# Patient Record
Sex: Female | Born: 1950 | Race: Asian | Hispanic: No | Marital: Married | Smoking: Never smoker
Health system: Southern US, Community
[De-identification: ages and names within clinical notes are randomized; demographics above are authoritative.]

## PROBLEM LIST (undated history)

## (undated) DIAGNOSIS — M8580 Other specified disorders of bone density and structure, unspecified site: Secondary | ICD-10-CM

## (undated) DIAGNOSIS — K219 Gastro-esophageal reflux disease without esophagitis: Secondary | ICD-10-CM

## (undated) DIAGNOSIS — H269 Unspecified cataract: Secondary | ICD-10-CM

## (undated) DIAGNOSIS — E785 Hyperlipidemia, unspecified: Secondary | ICD-10-CM

## (undated) DIAGNOSIS — R7611 Nonspecific reaction to tuberculin skin test without active tuberculosis: Secondary | ICD-10-CM

## (undated) DIAGNOSIS — M1990 Unspecified osteoarthritis, unspecified site: Secondary | ICD-10-CM

## (undated) HISTORY — DX: Unspecified cataract: H26.9

## (undated) HISTORY — DX: Nonspecific reaction to tuberculin skin test without active tuberculosis: R76.11

## (undated) HISTORY — DX: Other specified disorders of bone density and structure, unspecified site: M85.80

## (undated) HISTORY — DX: Hyperlipidemia, unspecified: E78.5

## (undated) HISTORY — PX: BREAST BIOPSY: SHX20

## (undated) HISTORY — DX: Gastro-esophageal reflux disease without esophagitis: K21.9

## (undated) HISTORY — DX: Unspecified osteoarthritis, unspecified site: M19.90

---

## 1994-03-31 HISTORY — PX: BREAST LUMPECTOMY: SHX2

## 1994-03-31 HISTORY — PX: BREAST EXCISIONAL BIOPSY: SUR124

## 1998-01-03 ENCOUNTER — Encounter: Payer: Self-pay | Admitting: Gynecology

## 1998-01-03 ENCOUNTER — Ambulatory Visit (HOSPITAL_COMMUNITY): Admission: RE | Admit: 1998-01-03 | Discharge: 1998-01-03 | Payer: Self-pay | Admitting: Gynecology

## 1998-09-05 ENCOUNTER — Encounter: Payer: Self-pay | Admitting: Internal Medicine

## 1998-09-05 ENCOUNTER — Ambulatory Visit (HOSPITAL_COMMUNITY): Admission: RE | Admit: 1998-09-05 | Discharge: 1998-09-05 | Payer: Self-pay | Admitting: Internal Medicine

## 1998-10-04 ENCOUNTER — Encounter: Payer: Self-pay | Admitting: Emergency Medicine

## 1998-10-04 ENCOUNTER — Emergency Department (HOSPITAL_COMMUNITY): Admission: EM | Admit: 1998-10-04 | Discharge: 1998-10-04 | Payer: Self-pay | Admitting: Emergency Medicine

## 1999-05-01 ENCOUNTER — Other Ambulatory Visit: Admission: RE | Admit: 1999-05-01 | Discharge: 1999-05-01 | Payer: Self-pay | Admitting: Gynecology

## 2000-06-12 ENCOUNTER — Encounter: Payer: Self-pay | Admitting: Gynecology

## 2000-06-12 ENCOUNTER — Ambulatory Visit (HOSPITAL_COMMUNITY): Admission: RE | Admit: 2000-06-12 | Discharge: 2000-06-12 | Payer: Self-pay | Admitting: Gynecology

## 2000-07-16 ENCOUNTER — Other Ambulatory Visit: Admission: RE | Admit: 2000-07-16 | Discharge: 2000-07-16 | Payer: Self-pay | Admitting: Gynecology

## 2001-08-16 ENCOUNTER — Encounter: Payer: Self-pay | Admitting: Internal Medicine

## 2001-08-16 ENCOUNTER — Ambulatory Visit (HOSPITAL_COMMUNITY): Admission: RE | Admit: 2001-08-16 | Discharge: 2001-08-16 | Payer: Self-pay | Admitting: Internal Medicine

## 2001-08-16 ENCOUNTER — Encounter: Payer: Self-pay | Admitting: Gynecology

## 2001-08-16 ENCOUNTER — Ambulatory Visit (HOSPITAL_COMMUNITY): Admission: RE | Admit: 2001-08-16 | Discharge: 2001-08-16 | Payer: Self-pay | Admitting: Gynecology

## 2001-09-01 ENCOUNTER — Other Ambulatory Visit: Admission: RE | Admit: 2001-09-01 | Discharge: 2001-09-01 | Payer: Self-pay | Admitting: Gynecology

## 2002-03-31 HISTORY — PX: WRIST FRACTURE SURGERY: SHX121

## 2002-04-13 ENCOUNTER — Encounter (INDEPENDENT_AMBULATORY_CARE_PROVIDER_SITE_OTHER): Payer: Self-pay | Admitting: Specialist

## 2002-04-13 ENCOUNTER — Ambulatory Visit (HOSPITAL_COMMUNITY): Admission: RE | Admit: 2002-04-13 | Discharge: 2002-04-13 | Payer: Self-pay | Admitting: Gastroenterology

## 2002-10-05 ENCOUNTER — Encounter: Payer: Self-pay | Admitting: Gynecology

## 2002-10-05 ENCOUNTER — Ambulatory Visit (HOSPITAL_COMMUNITY): Admission: RE | Admit: 2002-10-05 | Discharge: 2002-10-05 | Payer: Self-pay | Admitting: Gynecology

## 2002-10-17 ENCOUNTER — Other Ambulatory Visit: Admission: RE | Admit: 2002-10-17 | Discharge: 2002-10-17 | Payer: Self-pay | Admitting: Gynecology

## 2002-12-18 ENCOUNTER — Encounter: Payer: Self-pay | Admitting: Emergency Medicine

## 2002-12-18 ENCOUNTER — Emergency Department (HOSPITAL_COMMUNITY): Admission: EM | Admit: 2002-12-18 | Discharge: 2002-12-18 | Payer: Self-pay | Admitting: Emergency Medicine

## 2004-01-11 ENCOUNTER — Ambulatory Visit (HOSPITAL_COMMUNITY): Admission: RE | Admit: 2004-01-11 | Discharge: 2004-01-11 | Payer: Self-pay | Admitting: Gynecology

## 2004-02-08 ENCOUNTER — Other Ambulatory Visit: Admission: RE | Admit: 2004-02-08 | Discharge: 2004-02-08 | Payer: Self-pay | Admitting: Gynecology

## 2005-04-08 ENCOUNTER — Other Ambulatory Visit: Admission: RE | Admit: 2005-04-08 | Discharge: 2005-04-08 | Payer: Self-pay | Admitting: Gynecology

## 2005-06-27 ENCOUNTER — Ambulatory Visit (HOSPITAL_COMMUNITY): Admission: RE | Admit: 2005-06-27 | Discharge: 2005-06-27 | Payer: Self-pay | Admitting: Gynecology

## 2006-05-18 ENCOUNTER — Other Ambulatory Visit: Admission: RE | Admit: 2006-05-18 | Discharge: 2006-05-18 | Payer: Self-pay | Admitting: Gynecology

## 2006-07-13 ENCOUNTER — Ambulatory Visit (HOSPITAL_COMMUNITY): Admission: RE | Admit: 2006-07-13 | Discharge: 2006-07-13 | Payer: Self-pay | Admitting: Gastroenterology

## 2006-09-21 ENCOUNTER — Ambulatory Visit (HOSPITAL_COMMUNITY): Admission: RE | Admit: 2006-09-21 | Discharge: 2006-09-21 | Payer: Self-pay | Admitting: Gynecology

## 2007-02-03 ENCOUNTER — Encounter: Admission: RE | Admit: 2007-02-03 | Discharge: 2007-02-03 | Payer: Self-pay | Admitting: Internal Medicine

## 2007-07-15 ENCOUNTER — Other Ambulatory Visit: Admission: RE | Admit: 2007-07-15 | Discharge: 2007-07-15 | Payer: Self-pay | Admitting: Gynecology

## 2007-09-22 ENCOUNTER — Ambulatory Visit (HOSPITAL_COMMUNITY): Admission: RE | Admit: 2007-09-22 | Discharge: 2007-09-22 | Payer: Self-pay | Admitting: Internal Medicine

## 2008-11-16 ENCOUNTER — Ambulatory Visit (HOSPITAL_COMMUNITY): Admission: RE | Admit: 2008-11-16 | Discharge: 2008-11-16 | Payer: Self-pay | Admitting: Gynecology

## 2008-12-06 ENCOUNTER — Ambulatory Visit (HOSPITAL_COMMUNITY): Admission: RE | Admit: 2008-12-06 | Discharge: 2008-12-06 | Payer: Self-pay | Admitting: Gynecology

## 2009-11-20 ENCOUNTER — Ambulatory Visit (HOSPITAL_COMMUNITY): Admission: RE | Admit: 2009-11-20 | Discharge: 2009-11-20 | Payer: Self-pay | Admitting: Gynecology

## 2010-03-31 HISTORY — PX: SHOULDER ARTHROSCOPY: SHX128

## 2010-04-12 ENCOUNTER — Ambulatory Visit
Admission: RE | Admit: 2010-04-12 | Discharge: 2010-04-13 | Payer: Self-pay | Source: Home / Self Care | Attending: Orthopedic Surgery | Admitting: Orthopedic Surgery

## 2010-04-15 LAB — POCT HEMOGLOBIN-HEMACUE: Hemoglobin: 13.5 g/dL (ref 12.0–15.0)

## 2010-08-16 NOTE — Op Note (Signed)
   NAME:  Claire [name], Claire [name]                        ACCOUNT NO.:  [id number]   MEDICAL RECORD NO.:  [id number]                   PATIENT TYPE:  AMB   LOCATION:  ENDO                                 FACILITY:  [facility]   PHYSICIAN:  Bernette Redbird, M.D.                DATE OF BIRTH:  06-18-1950   DATE OF PROCEDURE:  04/13/2002  DATE OF DISCHARGE:                                 OPERATIVE REPORT   PROCEDURE PERFORMED:  Upper endoscopy.   ENDOSCOPIST:  Florencia Reasons, M.D.   INDICATIONS FOR PROCEDURE:  The patient is a 60 year old female with reflux  symptoms.   FINDINGS:  Normal exam.   DESCRIPTION OF PROCEDURE:  The nature, purpose and risks of the procedure  were familiar to the patient, who provided written consent.  Sedation was  fentanyl  25 mcg and Versed 2 mg IV without significant arrhythmias or  desaturation (she had a baseline bradycardia around 50).  The Olympus video  endoscope was passed under direct vision.  The vocal cords were not well  seen because the esophagus was very easily entered but no gross laryngeal  abnormalities were identified.   The esophagus was endoscopically normal.  There was no evidence of free  reflux, esophageal spasm, reflux esophagitis, Barrett's esophagus, varices,  infection, neoplasia or any ring, stricture or hiatal hernia.   Careful inspection of the Z-line showed slight irregularity and even a few  tiny islands of columnar appearing mucosa just above the Z-line but I did  not see any classic tongues of Barrett's mucosa and no biopsies were  obtained.   The stomach contained no significant residual and had normal mucosa  including a retroflex view of the cardia which showed no hiatal hernia.  No  gastritis, erosions, ulcers, polyps or masses were seen and the pylorus,  duodenal bulb and second duodenum looked normal.   The scope was then removed from the patient.  No biopsies were obtained.  The patient tolerated the procedure well and  there were no apparent  complications.   IMPRESSION:  Normal endoscopy.    PLAN:  The patient may continue to use p.r.n. proton pump inhibitor therapy  and I see no clear indication for endoscopic follow-up for this patient.                                                 Bernette Redbird, M.D.    RB/MEDQ  D:  04/13/2002  T:  04/13/2002  Job:  [id number]   cc:   Lucky Cowboy, M.D.  [address]  [place], [place] [address]  Fax: [phone]

## 2010-08-16 NOTE — Op Note (Signed)
NAME:  Claire [name], Claire [name]                        ACCOUNT NO.:  [id number]   MEDICAL RECORD NO.:  [id number]                   PATIENT TYPE:  AMB   LOCATION:  ENDO                                 FACILITY:  [facility]   PHYSICIAN:  Bernette Redbird, M.D.                DATE OF BIRTH:  [birth year]   DATE OF PROCEDURE:  04/13/2002  DATE OF DISCHARGE:                                 OPERATIVE REPORT   PROCEDURE PERFORMED:  Colonoscopy.   ENDOSCOPIST:  Florencia Reasons, M.D.   INDICATIONS FOR PROCEDURE:  The patient is a 60 year old female with a  family history of colon cancer in a grandparent at an advanced age, who also  has a previous history of possible collagenous colitis based on random  biopsies when she was bothered by diarrhea some years ago.  At this time she  has only occasional postprandial urgency with defecation and diarrhea.   FINDINGS:  Normal exam to the cecum.   DESCRIPTION OF PROCEDURE:  The nature, purpose and risks of the procedure  were familiar to the patient, who provided written consent.  Sedation for  this procedure and the upper endoscopy was fentanyl 50 mcg and Versed 3 mg  IV without clinical instability during the course of the procedure.   The Olympus adjustable tension pediatric video colonoscope was advanced  around the colon, turning the patient into the supine position and using  some external abdominal compression to control looping.  The cecum was  identified by visualization of the appendiceal orifice.  Pullback was then  performed.   There was a small amount of retained vegetable debris which could not be  suctioned up and which puddled here and there in the colon obscuring small  areas of the colonic mucosa but it is not felt that any large or significant  lesions would have been missed.  The prep otherwise was excellent.   This was a normal examination.  No polyps, cancer, masses, colitis, vascular  ectasia or diverticulosis were noted.   Retroflexion in the rectum as well as  reinspection of the rectosigmoid was otherwise unremarkable as well.  In the  cecum there was some questionable mottled erythema but I don't think this  was clinically significant.  There was no evidence of any erosive changes,  exudate, etc.   Random mucosal biopsies were obtained along the length of the colon to look  for any evidence of ongoing collagenous colitis during pullback of the  scope.  The patient tolerated the procedure well and there were no apparent  complications.   IMPRESSION:  Normal colonoscopy.   PLAN:  Await pathology and random mucosal biopsies, although, even if they  show some collagenous colitis, since the patient has minimal symptoms at  this time, I don't feel that therapy would be imperative.   The patient tolerated the procedure well and there were no apparent  complications.  IMPRESSION:  1.  2.   PLAN:                                                 Bernette Redbird, M.D.    RB/MEDQ  D:  04/13/2002  T:  04/13/2002  Job:  [id number]   cc:   Lucky Cowboy, M.D.  [address]  [place], [place] [address]  Fax: [phone]

## 2011-01-09 ENCOUNTER — Other Ambulatory Visit (HOSPITAL_COMMUNITY): Payer: Self-pay | Admitting: Gynecology

## 2011-01-09 DIAGNOSIS — Z1231 Encounter for screening mammogram for malignant neoplasm of breast: Secondary | ICD-10-CM

## 2011-01-22 ENCOUNTER — Ambulatory Visit (HOSPITAL_COMMUNITY)
Admission: RE | Admit: 2011-01-22 | Discharge: 2011-01-22 | Disposition: A | Discharge status: Home / Self Care | Payer: BC Managed Care – PPO | Source: Ambulatory Visit | Attending: Gynecology | Admitting: Gynecology

## 2011-01-22 DIAGNOSIS — Z1231 Encounter for screening mammogram for malignant neoplasm of breast: Secondary | ICD-10-CM | POA: Insufficient documentation

## 2011-10-28 ENCOUNTER — Ambulatory Visit: Payer: BC Managed Care – PPO | Attending: Physical Medicine and Rehabilitation | Admitting: Rehabilitation

## 2011-10-28 DIAGNOSIS — M545 Low back pain, unspecified: Secondary | ICD-10-CM | POA: Insufficient documentation

## 2011-10-28 DIAGNOSIS — IMO0001 Reserved for inherently not codable concepts without codable children: Secondary | ICD-10-CM | POA: Insufficient documentation

## 2011-10-28 DIAGNOSIS — R293 Abnormal posture: Secondary | ICD-10-CM | POA: Insufficient documentation

## 2011-10-30 ENCOUNTER — Ambulatory Visit: Payer: BC Managed Care – PPO | Attending: Physical Medicine and Rehabilitation | Admitting: Rehabilitation

## 2011-10-30 DIAGNOSIS — R293 Abnormal posture: Secondary | ICD-10-CM | POA: Insufficient documentation

## 2011-10-30 DIAGNOSIS — M545 Low back pain, unspecified: Secondary | ICD-10-CM | POA: Insufficient documentation

## 2011-10-30 DIAGNOSIS — IMO0001 Reserved for inherently not codable concepts without codable children: Secondary | ICD-10-CM | POA: Insufficient documentation

## 2011-11-04 ENCOUNTER — Ambulatory Visit: Payer: BC Managed Care – PPO | Admitting: Rehabilitation

## 2011-11-11 ENCOUNTER — Ambulatory Visit: Payer: BC Managed Care – PPO | Admitting: Rehabilitation

## 2011-11-13 ENCOUNTER — Ambulatory Visit: Payer: BC Managed Care – PPO | Admitting: Rehabilitation

## 2011-11-18 ENCOUNTER — Ambulatory Visit: Payer: BC Managed Care – PPO | Admitting: Rehabilitation

## 2011-11-20 ENCOUNTER — Ambulatory Visit: Payer: BC Managed Care – PPO | Admitting: Rehabilitation

## 2011-11-26 ENCOUNTER — Other Ambulatory Visit (HOSPITAL_COMMUNITY): Payer: Self-pay | Admitting: Orthopedic Surgery

## 2011-11-26 DIAGNOSIS — M79672 Pain in left foot: Secondary | ICD-10-CM

## 2011-11-26 DIAGNOSIS — M84375A Stress fracture, left foot, initial encounter for fracture: Secondary | ICD-10-CM

## 2011-12-05 ENCOUNTER — Encounter (HOSPITAL_COMMUNITY): Payer: BC Managed Care – PPO

## 2011-12-05 ENCOUNTER — Other Ambulatory Visit (HOSPITAL_COMMUNITY): Payer: BC Managed Care – PPO

## 2011-12-09 ENCOUNTER — Encounter: Payer: BC Managed Care – PPO | Admitting: Rehabilitation

## 2011-12-10 ENCOUNTER — Encounter (HOSPITAL_COMMUNITY)
Admission: RE | Admit: 2011-12-10 | Discharge: 2011-12-10 | Disposition: A | Discharge status: Home / Self Care | Payer: BC Managed Care – PPO | Source: Ambulatory Visit | Attending: Orthopedic Surgery | Admitting: Orthopedic Surgery

## 2011-12-10 DIAGNOSIS — M79609 Pain in unspecified limb: Secondary | ICD-10-CM | POA: Insufficient documentation

## 2011-12-10 DIAGNOSIS — M79672 Pain in left foot: Secondary | ICD-10-CM

## 2011-12-10 DIAGNOSIS — M84375A Stress fracture, left foot, initial encounter for fracture: Secondary | ICD-10-CM

## 2011-12-10 MED ORDER — TECHNETIUM TC 99M MEDRONATE IV KIT
25.0000 | PACK | Freq: Once | INTRAVENOUS | Status: AC | PRN
  Administered 2011-12-10: 25 via INTRAVENOUS

## 2012-10-26 ENCOUNTER — Other Ambulatory Visit: Payer: Self-pay | Admitting: Gastroenterology

## 2013-11-29 DIAGNOSIS — H02839 Dermatochalasis of unspecified eye, unspecified eyelid: Secondary | ICD-10-CM | POA: Diagnosis not present

## 2013-11-29 DIAGNOSIS — H18419 Arcus senilis, unspecified eye: Secondary | ICD-10-CM | POA: Diagnosis not present

## 2013-11-29 DIAGNOSIS — H2510 Age-related nuclear cataract, unspecified eye: Secondary | ICD-10-CM | POA: Diagnosis not present

## 2013-11-29 DIAGNOSIS — H04129 Dry eye syndrome of unspecified lacrimal gland: Secondary | ICD-10-CM | POA: Diagnosis not present

## 2013-12-01 DIAGNOSIS — L908 Other atrophic disorders of skin: Secondary | ICD-10-CM | POA: Diagnosis not present

## 2013-12-01 DIAGNOSIS — D1801 Hemangioma of skin and subcutaneous tissue: Secondary | ICD-10-CM | POA: Diagnosis not present

## 2013-12-01 DIAGNOSIS — M7130 Other bursal cyst, unspecified site: Secondary | ICD-10-CM | POA: Diagnosis not present

## 2013-12-01 DIAGNOSIS — D235 Other benign neoplasm of skin of trunk: Secondary | ICD-10-CM | POA: Diagnosis not present

## 2013-12-01 DIAGNOSIS — L821 Other seborrheic keratosis: Secondary | ICD-10-CM | POA: Diagnosis not present

## 2013-12-01 DIAGNOSIS — L918 Other hypertrophic disorders of the skin: Secondary | ICD-10-CM | POA: Diagnosis not present

## 2013-12-01 DIAGNOSIS — L819 Disorder of pigmentation, unspecified: Secondary | ICD-10-CM | POA: Diagnosis not present

## 2013-12-01 DIAGNOSIS — D239 Other benign neoplasm of skin, unspecified: Secondary | ICD-10-CM | POA: Diagnosis not present

## 2013-12-15 DIAGNOSIS — M7150 Other bursitis, not elsewhere classified, unspecified site: Secondary | ICD-10-CM | POA: Diagnosis not present

## 2013-12-15 DIAGNOSIS — M79609 Pain in unspecified limb: Secondary | ICD-10-CM | POA: Diagnosis not present

## 2013-12-15 DIAGNOSIS — M25579 Pain in unspecified ankle and joints of unspecified foot: Secondary | ICD-10-CM | POA: Diagnosis not present

## 2013-12-15 DIAGNOSIS — M2010 Hallux valgus (acquired), unspecified foot: Secondary | ICD-10-CM | POA: Diagnosis not present

## 2013-12-21 DIAGNOSIS — Z23 Encounter for immunization: Secondary | ICD-10-CM | POA: Diagnosis not present

## 2014-01-03 DIAGNOSIS — M47817 Spondylosis without myelopathy or radiculopathy, lumbosacral region: Secondary | ICD-10-CM | POA: Diagnosis not present

## 2014-01-03 DIAGNOSIS — M545 Low back pain: Secondary | ICD-10-CM | POA: Diagnosis not present

## 2014-02-28 DIAGNOSIS — E785 Hyperlipidemia, unspecified: Secondary | ICD-10-CM | POA: Diagnosis not present

## 2014-05-24 DIAGNOSIS — M19041 Primary osteoarthritis, right hand: Secondary | ICD-10-CM | POA: Diagnosis not present

## 2014-05-24 DIAGNOSIS — M19011 Primary osteoarthritis, right shoulder: Secondary | ICD-10-CM | POA: Diagnosis not present

## 2014-08-02 ENCOUNTER — Ambulatory Visit (INDEPENDENT_AMBULATORY_CARE_PROVIDER_SITE_OTHER): Payer: Medicare Other | Admitting: Family Medicine

## 2014-08-02 ENCOUNTER — Encounter: Payer: Self-pay | Admitting: Family Medicine

## 2014-08-02 VITALS — BP 120/78 | HR 65 | Temp 98.3°F | Ht 61.42 in | Wt 131.0 lb

## 2014-08-02 DIAGNOSIS — R7611 Nonspecific reaction to tuberculin skin test without active tuberculosis: Secondary | ICD-10-CM

## 2014-08-02 DIAGNOSIS — K219 Gastro-esophageal reflux disease without esophagitis: Secondary | ICD-10-CM

## 2014-08-02 DIAGNOSIS — M19042 Primary osteoarthritis, left hand: Secondary | ICD-10-CM

## 2014-08-02 DIAGNOSIS — T50A95A Adverse effect of other bacterial vaccines, initial encounter: Secondary | ICD-10-CM

## 2014-08-02 DIAGNOSIS — E785 Hyperlipidemia, unspecified: Secondary | ICD-10-CM | POA: Insufficient documentation

## 2014-08-02 DIAGNOSIS — M8580 Other specified disorders of bone density and structure, unspecified site: Secondary | ICD-10-CM | POA: Insufficient documentation

## 2014-08-02 DIAGNOSIS — L659 Nonscarring hair loss, unspecified: Secondary | ICD-10-CM

## 2014-08-02 DIAGNOSIS — M19041 Primary osteoarthritis, right hand: Secondary | ICD-10-CM | POA: Insufficient documentation

## 2014-08-02 MED ORDER — PANTOPRAZOLE SODIUM 40 MG PO TBEC: 40.0000 mg | DELAYED_RELEASE_TABLET | Freq: Every day | ORAL | Status: DC

## 2014-08-02 MED ORDER — ATOVAQUONE-PROGUANIL HCL 250-100 MG PO TABS: ORAL_TABLET | ORAL | Status: DC

## 2014-08-02 MED ORDER — CIPROFLOXACIN HCL 500 MG PO TABS: 500.0000 mg | ORAL_TABLET | Freq: Two times a day (BID) | ORAL | Status: DC

## 2014-08-02 NOTE — Progress Notes (Signed)
Subjective:    Patient ID: Claire [name], female    DOB: 26-Apr-1950, 64 y.o.   MRN: [id number]  HPI Patient seen to establish care. 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 She has previously been seen by internal medicine physician in [place]. Surgical history reviewed. She's had previous fracture from fall including right wrist. She takes Fosamax for osteopenia as well as calcium and vitamin D.  Requesting prescription for pantoprazole which she has taken for reflux with good success the past. No dysphagia  She takes Lipitor for hyperlipidemia. No history of CAD or peripheral vascular disease. Generally gets complete physical yearly  Recently noted some very localized hair loss right parietal area area approximately one half centimeter. No generalized alopecia.  Trip this summer to the [place]  Needs malaria prevention.  Hep A has already been given and tetanus up to date.  Family history and social history reviewed:  Past Medical History  Diagnosis Date  . Arthritis   . GERD (gastroesophageal reflux disease)   . Positive TB test   . Osteopenia    Past Surgical History  Procedure Laterality Date  . Cesarean section  1982  . Breast lumpectomy Left 1996    benign  . Wrist fracture surgery Right 2004    Right  . Shoulder arthroscopy Left 2012    left    reports that she has never smoked. She has never used smokeless tobacco. She reports that she does not drink alcohol. Her drug history is not on file. family history includes Hyperlipidemia in her father. Allergies  Allergen Reactions  . Penicillins       Review of Systems  Constitutional: Negative for fatigue and unexpected weight change.  Eyes: Negative for visual disturbance.  Respiratory: Negative for cough, chest tightness, shortness of breath and wheezing.    Cardiovascular: Negative for chest pain, palpitations and leg swelling.  Musculoskeletal: Positive for arthralgias.  Neurological: Negative for dizziness, seizures, syncope, weakness, light-headedness and headaches.       Objective:   Physical Exam  Constitutional: She appears well-developed and well-nourished.  HENT:  Right Ear: External ear normal.  Left Ear: External ear normal.  Mouth/Throat: Oropharynx is clear and moist.  Neck: Neck supple. No thyromegaly present.  Cardiovascular: Normal rate and regular rhythm.   Pulmonary/Chest: Effort normal and breath sounds normal. No respiratory distress. She has no wheezes. She has no rales.  Musculoskeletal: She exhibits no edema.  She has some hypertrophic changes of both hands consistent with osteoarthritis  Lymphadenopathy:    She has no cervical adenopathy.  Skin:  1 cm circumferential area of hair loss right parietal scalp.  Non-scaly.           Assessment & Plan:  #1 hyperlipidemia. Patient on Lipitor. Will check lipids at follow-up for physical #2 GERD. Prescription for pantoprazole 40 mg once daily. #3 osteoarthritis involving hands. Minimal pain. Minimize non-steroidals, if possible #4 osteopenia. Discuss setting up DEXA scan at physical. 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 Upcoming travel to [place] Her tetanus and hepatitis A are up-to-date. Cipro 500 mg 1 twice a day for 3 days as needed for traveler's diarrhea. Malarone 250 mg 1 daily starting 1-2 days prior to travel, during travel, and for one week after return. #7  Very  localized hair loss.  ?Alopecia areata.  Consider derm referral if area enlarging, otherwise will keep an eye on this.

## 2014-09-19 ENCOUNTER — Ambulatory Visit: Payer: BC Managed Care – PPO | Admitting: Family Medicine

## 2014-09-29 LAB — HM MAMMOGRAPHY: HM Mammogram: NORMAL (ref 0–4)

## 2014-09-29 LAB — HM PAP SMEAR: HM PAP: NORMAL

## 2014-10-03 ENCOUNTER — Ambulatory Visit (INDEPENDENT_AMBULATORY_CARE_PROVIDER_SITE_OTHER): Payer: Medicare Other | Admitting: Family Medicine

## 2014-10-03 ENCOUNTER — Encounter: Payer: Self-pay | Admitting: Family Medicine

## 2014-10-03 VITALS — BP 120/70 | HR 58 | Temp 97.7°F | Ht 61.42 in | Wt 133.0 lb

## 2014-10-03 DIAGNOSIS — E785 Hyperlipidemia, unspecified: Secondary | ICD-10-CM | POA: Diagnosis not present

## 2014-10-03 DIAGNOSIS — Z23 Encounter for immunization: Secondary | ICD-10-CM | POA: Diagnosis not present

## 2014-10-03 DIAGNOSIS — M8580 Other specified disorders of bone density and structure, unspecified site: Secondary | ICD-10-CM | POA: Diagnosis not present

## 2014-10-03 DIAGNOSIS — Z862 Personal history of diseases of the blood and blood-forming organs and certain disorders involving the immune mechanism: Secondary | ICD-10-CM | POA: Diagnosis not present

## 2014-10-03 DIAGNOSIS — L659 Nonscarring hair loss, unspecified: Secondary | ICD-10-CM | POA: Diagnosis not present

## 2014-10-03 DIAGNOSIS — Z0000 Encounter for general adult medical examination without abnormal findings: Secondary | ICD-10-CM | POA: Diagnosis not present

## 2014-10-03 DIAGNOSIS — K219 Gastro-esophageal reflux disease without esophagitis: Secondary | ICD-10-CM

## 2014-10-03 LAB — BASIC METABOLIC PANEL
BUN: 15 mg/dL (ref 6–23)
CHLORIDE: 105 meq/L (ref 96–112)
CO2: 28 meq/L (ref 19–32)
CREATININE: 0.79 mg/dL (ref 0.40–1.20)
Calcium: 9.7 mg/dL (ref 8.4–10.5)
GFR: 77.44 mL/min (ref >60.00)
Glucose, Bld: 101 mg/dL — ABNORMAL HIGH (ref 70–99)
Potassium: 3.8 mEq/L (ref 3.5–5.1)
SODIUM: 141 meq/L (ref 135–145)

## 2014-10-03 LAB — CBC WITH DIFFERENTIAL/PLATELET
BASOS PCT: 0.4 % (ref 0.0–3.0)
Basophils Absolute: 0 10*3/uL (ref 0.0–0.1)
EOS PCT: 3.5 % (ref 0.0–5.0)
Eosinophils Absolute: 0.1 10*3/uL (ref 0.0–0.7)
HCT: 40.1 % (ref 36.0–46.0)
HEMOGLOBIN: 13.4 g/dL (ref 12.0–15.0)
LYMPHS PCT: 42.8 % (ref 12.0–46.0)
Lymphs Abs: 1.8 10*3/uL (ref 0.7–4.0)
MCHC: 33.4 g/dL (ref 30.0–36.0)
MCV: 89.8 fl (ref 78.0–100.0)
MONOS PCT: 6.9 % (ref 3.0–12.0)
Monocytes Absolute: 0.3 10*3/uL (ref 0.1–1.0)
NEUTROS ABS: 1.9 10*3/uL (ref 1.4–7.7)
Neutrophils Relative %: 46.4 % (ref 43.0–77.0)
Platelets: 235 10*3/uL (ref 150.0–400.0)
RBC: 4.47 Mil/uL (ref 3.87–5.11)
RDW: 13.6 % (ref 11.5–15.5)
WBC: 4.1 10*3/uL (ref 4.0–10.5)

## 2014-10-03 LAB — LIPID PANEL
CHOL/HDL RATIO: 3
CHOLESTEROL: 204 mg/dL — AB (ref 0–200)
HDL: 61.4 mg/dL (ref >39.00)
LDL CALC: 114 mg/dL — AB (ref 0–99)
NonHDL: 142.6
TRIGLYCERIDES: 141 mg/dL (ref 0.0–149.0)
VLDL: 28.2 mg/dL (ref 0.0–40.0)

## 2014-10-03 LAB — HEPATIC FUNCTION PANEL
ALT: 33 U/L (ref 0–35)
AST: 28 U/L (ref 0–37)
Albumin: 4.2 g/dL (ref 3.5–5.2)
Alkaline Phosphatase: 44 U/L (ref 39–117)
Bilirubin, Direct: 0.1 mg/dL (ref 0.0–0.3)
TOTAL PROTEIN: 7.4 g/dL (ref 6.0–8.3)
Total Bilirubin: 0.7 mg/dL (ref 0.2–1.2)

## 2014-10-03 LAB — TSH: TSH: 4.66 u[IU]/mL — ABNORMAL HIGH (ref 0.35–4.50)

## 2014-10-03 NOTE — Progress Notes (Signed)
Pre visit review using our clinic review tool, if applicable. No additional management support is needed unless otherwise documented below in the visit note. 

## 2014-10-03 NOTE — Patient Instructions (Signed)
Continue yearly flu vaccine. Continue regular use of calcium and vitamin D and weightbearing exercise Would consider two-year interval off Fosamax if you've been on this continuously for more than 5 years Continue with yearly mammograms

## 2014-10-03 NOTE — Progress Notes (Signed)
Subjective:    Patient ID: Claire [name], female    DOB: 24-Mar-1951, 64 y.o.   MRN: [id number]  HPI Patient is here for Medicare wellness exam and medical follow-up. Her chronic problems include history of GERD, osteopenia, osteoarthritis, hyperlipidemia. She has history of positive PPD related to BCG vaccination in childhood. She's had her last tetanus less than 10 years ago. We do not have exact date. Last colonoscopy about 5 years ago. She gets mammograms every year and this is up-to-date. She has history of reported osteopenia. Last DEXA scan over 2 years ago. On regular calcium and vitamin D.  Recent mild alopecia right parietal area. No fatigue issues. No recent falls. No depression.  Hyperlipidemia treated with atorvastatin. No myalgias. She has GERD which has been controlled with pantoprazole. Occasional breakthrough symptoms. No dysphagia. No appetite or weight changes.  1.  Risk factors based on Past Medical , Social, and Family history reviewed and as indicated above with no changes 2.  Limitations in physical activities None.  No recent falls. 3.  Depression/mood No active depression or anxiety issues 4.  Hearing No defiits 5.  ADLs independent in all. 6.  Cognitive function (orientation to time and place, language, writing, speech,memory) no short or long term memory issues.  Language and judgement intact. 7.  Home Safety no issues 8.  Height, weight, and visual acuity.all stable. 9.  Counseling discussed continue weight bearing exercise and regular calcium and vitamin D intake 10. Recommendation of preventive services. Repeat DEXA scan. Prevnar 13. Confirm date of last tetanus. Yearly flu vaccine. Continue with regular mammograms. 11. Labs based on risk factors lipid, hepatic, CBC, TSH 12. Care Plan as above 13. Other Providers Dr Ramond Marrow 14. Written schedule of screening/prevention services given to patient.   Past Medical History  Diagnosis Date  . Arthritis   .  GERD (gastroesophageal reflux disease)   . Positive TB test   . Osteopenia    Past Surgical History  Procedure Laterality Date  . Cesarean section  1982  . Breast lumpectomy Left 1996    benign  . Wrist fracture surgery Right 2004    Right  . Shoulder arthroscopy Left 2012    left    reports that she has never smoked. She has never used smokeless tobacco. She reports that she does not drink alcohol. Her drug history is not on file. family history includes Hyperlipidemia in her father. Allergies  Allergen Reactions  . Penicillins       Review of Systems  Constitutional: Negative for fever, activity change, appetite change, fatigue and unexpected weight change.  HENT: Negative for ear pain, hearing loss, sore throat and trouble swallowing.   Eyes: Negative for visual disturbance.  Respiratory: Negative for cough and shortness of breath.   Cardiovascular: Negative for chest pain and palpitations.  Gastrointestinal: Negative for abdominal pain, diarrhea, constipation and blood in stool.  Genitourinary: Negative for dysuria and hematuria.  Musculoskeletal: Negative for myalgias, back pain and arthralgias.  Skin: Negative for rash.  Neurological: Negative for dizziness, syncope and headaches.  Hematological: Negative for adenopathy.  Psychiatric/Behavioral: Negative for confusion and dysphoric mood.       Objective:   Physical Exam  Constitutional: She is oriented to person, place, and time. She appears well-developed and well-nourished.  HENT:  Head: Normocephalic and atraumatic.  Eyes: EOM are normal. Pupils are equal, round, and reactive to light.  Neck: Normal range of motion. Neck supple. No thyromegaly present.  Cardiovascular: Normal  rate, regular rhythm and normal heart sounds.   No murmur heard. Pulmonary/Chest: Breath sounds normal. No respiratory distress. She has no wheezes. She has no rales.  Abdominal: Soft. Bowel sounds are normal. She exhibits no distension  and no mass. There is no tenderness. There is no rebound and no guarding.  Genitourinary:  Per GYN  Musculoskeletal: Normal range of motion. She exhibits no edema.  Lymphadenopathy:    She has no cervical adenopathy.  Neurological: She is alert and oriented to person, place, and time. She displays normal reflexes. No cranial nerve deficit.  Skin: No rash noted.  Psychiatric: She has a normal mood and affect. Her behavior is normal. Judgment and thought content normal.          Assessment & Plan:  #1 Medicare wellness exam. Health maintenance issues addressed. Prevnar 13 given. Set up repeat DEXA scan. Continue regular calcium and vitamin D. Continue regular mammograms and yearly flu vaccine. Tetanus and colonoscopy up-to-date #2 mild alopecia. Check TSH #3 hyperlipidemia. Continue atorvastatin. Check lipid and hepatic panel #4 reported history of mild anemia. Check CBC  #5 history of osteopenia. Repeat DEXA scan. She's been on Fosamax reportedly for over 10 years. We recommended discontinue and consider couple year interval off this

## 2014-10-05 ENCOUNTER — Telehealth: Payer: Self-pay | Admitting: Family Medicine

## 2014-10-05 NOTE — Telephone Encounter (Signed)
Pt says she is returning Montrice's call.

## 2014-10-05 NOTE — Telephone Encounter (Signed)
Pt informed

## 2014-10-12 DIAGNOSIS — M1811 Unilateral primary osteoarthritis of first carpometacarpal joint, right hand: Secondary | ICD-10-CM | POA: Diagnosis not present

## 2014-10-12 DIAGNOSIS — M25531 Pain in right wrist: Secondary | ICD-10-CM | POA: Diagnosis not present

## 2014-10-23 ENCOUNTER — Other Ambulatory Visit: Payer: Medicare Other

## 2014-11-08 ENCOUNTER — Ambulatory Visit (INDEPENDENT_AMBULATORY_CARE_PROVIDER_SITE_OTHER)
Admission: RE | Admit: 2014-11-08 | Discharge: 2014-11-08 | Disposition: A | Discharge status: Home / Self Care | Payer: Medicare Other | Source: Ambulatory Visit | Attending: Family Medicine | Admitting: Family Medicine

## 2014-11-08 DIAGNOSIS — Z780 Asymptomatic menopausal state: Secondary | ICD-10-CM | POA: Diagnosis not present

## 2014-11-08 DIAGNOSIS — M8580 Other specified disorders of bone density and structure, unspecified site: Secondary | ICD-10-CM

## 2014-11-16 DIAGNOSIS — Z6824 Body mass index (BMI) 24.0-24.9, adult: Secondary | ICD-10-CM | POA: Diagnosis not present

## 2014-11-16 DIAGNOSIS — Z1231 Encounter for screening mammogram for malignant neoplasm of breast: Secondary | ICD-10-CM | POA: Diagnosis not present

## 2014-11-16 DIAGNOSIS — Z124 Encounter for screening for malignant neoplasm of cervix: Secondary | ICD-10-CM | POA: Diagnosis not present

## 2014-11-22 ENCOUNTER — Ambulatory Visit (INDEPENDENT_AMBULATORY_CARE_PROVIDER_SITE_OTHER): Payer: Medicare Other | Admitting: Family Medicine

## 2014-11-22 ENCOUNTER — Encounter: Payer: Self-pay | Admitting: Family Medicine

## 2014-11-22 VITALS — BP 124/80 | HR 73 | Temp 98.4°F | Wt 134.0 lb

## 2014-11-22 DIAGNOSIS — L03011 Cellulitis of right finger: Secondary | ICD-10-CM

## 2014-11-22 MED ORDER — DOXYCYCLINE HYCLATE 100 MG PO CAPS: 100.0000 mg | ORAL_CAPSULE | Freq: Two times a day (BID) | ORAL | Status: DC

## 2014-11-22 NOTE — Progress Notes (Signed)
   Subjective:    Patient ID: Claire [name], female    DOB: [birth year], 64 y.o.   MRN: [id number]  HPI Patient seen with right index finger pain and swelling. She describes what sounds like a chronic cyst on this finger possibly a mucous cyst but she also has Heberden's  nodules consistent with osteoarthritis. In any event, 2 days ago she states the cyst ruptured. Yesterday she noticed some swelling and redness involving the finger. No fevers or chills. No history of MRSA  Past Medical History  Diagnosis Date  . Arthritis   . GERD (gastroesophageal reflux disease)   . Positive TB test   . Osteopenia    Past Surgical History  Procedure Laterality Date  . Cesarean section  1982  . Breast lumpectomy Left 1996    benign  . Wrist fracture surgery Right 2004    Right  . Shoulder arthroscopy Left 2012    left    reports that she has never smoked. She has never used smokeless tobacco. She reports that she does not drink alcohol. Her drug history is not on file. family history includes Hyperlipidemia in her father. Allergies  Allergen Reactions  . Penicillins       Review of Systems  Constitutional: Negative for fever and chills.       Objective:   Physical Exam  Constitutional: She appears well-developed and well-nourished.  Cardiovascular: Normal rate and regular rhythm.   Musculoskeletal:  Right index finger she has deformities consistent with severe osteoarthritis of the DIP joint. She has what appears to be a small abrasion over the DIP versus open area from ruptured cyst. She has some mild redness and swelling of the distal aspect of the index finger. Slightly tender to palpation. No visible purulence and no fluctuance          Assessment & Plan:  Cellulitis involving her right index finger. No obvious abscess. No evidence for Felon. No paronychia Warm saltwater soaks several times daily. Doxycycline 100 mg twice a day for 10 days. Follow-up promptly for any  worsening symptoms

## 2014-11-22 NOTE — Patient Instructions (Signed)
Elevate hand frequently Consider salt water soaks Be in touch if not improving over the next 2 days and sooner as needed.

## 2014-11-22 NOTE — Progress Notes (Signed)
Pre visit review using our clinic review tool, if applicable. No additional management support is needed unless otherwise documented below in the visit note. 

## 2014-11-23 ENCOUNTER — Encounter: Payer: Self-pay | Admitting: Family Medicine

## 2014-11-23 ENCOUNTER — Ambulatory Visit (INDEPENDENT_AMBULATORY_CARE_PROVIDER_SITE_OTHER): Payer: Medicare Other | Admitting: Family Medicine

## 2014-11-23 ENCOUNTER — Telehealth: Payer: Self-pay | Admitting: Family Medicine

## 2014-11-23 VITALS — BP 120/80 | HR 79 | Temp 98.3°F | Wt 134.0 lb

## 2014-11-23 DIAGNOSIS — L02511 Cutaneous abscess of right hand: Secondary | ICD-10-CM

## 2014-11-23 MED ORDER — CEFTRIAXONE SODIUM 1 G IJ SOLR
1.0000 g | Freq: Once | INTRAMUSCULAR | Status: AC
  Administered 2014-11-23: 1 g via INTRAMUSCULAR

## 2014-11-23 NOTE — Telephone Encounter (Signed)
Do you want patient to be seen this afternoon?

## 2014-11-23 NOTE — Addendum Note (Signed)
Addended by: Marcina Millard on: 11/23/2014 02:50 PM   Modules accepted: Orders

## 2014-11-23 NOTE — Telephone Encounter (Signed)
We should reassess this afternoon

## 2014-11-23 NOTE — Telephone Encounter (Signed)
Pt informed. Pt seen in office at 1:30pm on 11/23/14

## 2014-11-23 NOTE — Progress Notes (Addendum)
   Subjective:    Patient ID: Claire [name], female    DOB: 1950/11/17, 64 y.o.   MRN: [id number]  HPI Follow-up regarding right index finger cellulitis. Refer to note yesterday:  "Patient seen with right index finger pain and swelling. She describes what sounds like a chronic cyst on this finger possibly a mucous cyst but she also has Heberden's nodules consistent with osteoarthritis. In any event, 2 days ago she states the cyst ruptured. Yesterday she noticed some swelling and redness involving the finger. No fevers or chills. No history of MRSA"  She did not have any fluctuance yesterday and minimal erythema and no evidence for any purulent drainage or abscess and we started doxycycline 100 mg twice a day. She has taken a total of 3 doses. By today, her swelling, redness, and pain had all increased and she noticed some pus draining from the dorsal aspect of the right index finger about midway between the DIP joint and nail matrix. No fevers or chills.  Past Medical History  Diagnosis Date  . Arthritis   . GERD (gastroesophageal reflux disease)   . Positive TB test   . Osteopenia    Past Surgical History  Procedure Laterality Date  . Cesarean section  1982  . Breast lumpectomy Left 1996    benign  . Wrist fracture surgery Right 2004    Right  . Shoulder arthroscopy Left 2012    left    reports that she has never smoked. She has never used smokeless tobacco. She reports that she does not drink alcohol. Her drug history is not on file. family history includes Hyperlipidemia in her father. Allergies  Allergen Reactions  . Penicillins       Review of Systems  Constitutional: Negative for fever and chills.  Gastrointestinal: Negative for nausea and vomiting.       Objective:   Physical Exam  Constitutional: She appears well-developed and well-nourished.  Cardiovascular: Normal rate and regular rhythm.   Pulmonary/Chest: Effort normal and breath sounds normal. No  respiratory distress. She has no wheezes. She has no rales.  Musculoskeletal:  Right index finger reveals increased swelling especially involving the distal aspect but she has some swelling of the entire index finger and progression of erythema. Yesterday she had erythema just confined to the distal portion but today this extends all the way to the MCP joint. She has increased swelling and some fluctuance now distal to the DIP joint. There is some purulence expressed through open spot in the skin. Very tender and warm to touch          Assessment & Plan:  Abscess/cellulitis right index finger. This has progressed substantially since yesterday after starting doxycycline. We'll try to get in as soon as possible to hand surgeon.  We were able to get her in to see hand surgeon tomorrow morning. We elected to go and give Rocephin 1 g IM. She had reported allergy to penicillin but has taken Keflex before without difficulty. On further inquiry, she states that she was 64 years old she "passed out" after receiving injection but this sounded more like vasovagal reaction she had no rash and no angioedema and no dyspnea. There is no good evidence from history that she has penicillin allergy.

## 2014-11-23 NOTE — Telephone Encounter (Signed)
Pt states she bellieves her finger is worse.  She has taken the med and was to let you know if pain went to hand Now there is pain to the hand, pt has pus coming out as well She knows it has been only one day. But pt doesn't want to get to the weekend and be worse. pls advise  [facility]/ [address]

## 2014-11-23 NOTE — Progress Notes (Signed)
Pre visit review using our clinic review tool, if applicable. No additional management support is needed unless otherwise documented below in the visit note. 

## 2014-11-24 DIAGNOSIS — S60940A Unspecified superficial injury of right index finger, initial encounter: Secondary | ICD-10-CM | POA: Diagnosis not present

## 2014-11-24 DIAGNOSIS — L089 Local infection of the skin and subcutaneous tissue, unspecified: Secondary | ICD-10-CM | POA: Diagnosis not present

## 2014-11-25 LAB — WOUND CULTURE
GRAM STAIN: NONE SEEN
GRAM STAIN: NONE SEEN
Gram Stain: NONE SEEN

## 2014-11-27 DIAGNOSIS — M659 Synovitis and tenosynovitis, unspecified: Secondary | ICD-10-CM | POA: Diagnosis not present

## 2014-11-28 DIAGNOSIS — L089 Local infection of the skin and subcutaneous tissue, unspecified: Secondary | ICD-10-CM | POA: Diagnosis not present

## 2014-11-28 DIAGNOSIS — S60940A Unspecified superficial injury of right index finger, initial encounter: Secondary | ICD-10-CM | POA: Diagnosis not present

## 2014-12-08 DIAGNOSIS — M1811 Unilateral primary osteoarthritis of first carpometacarpal joint, right hand: Secondary | ICD-10-CM | POA: Diagnosis not present

## 2014-12-08 DIAGNOSIS — S60940A Unspecified superficial injury of right index finger, initial encounter: Secondary | ICD-10-CM | POA: Diagnosis not present

## 2014-12-08 DIAGNOSIS — L089 Local infection of the skin and subcutaneous tissue, unspecified: Secondary | ICD-10-CM | POA: Diagnosis not present

## 2015-01-30 DIAGNOSIS — Z23 Encounter for immunization: Secondary | ICD-10-CM | POA: Diagnosis not present

## 2015-02-06 ENCOUNTER — Ambulatory Visit (INDEPENDENT_AMBULATORY_CARE_PROVIDER_SITE_OTHER): Payer: Medicare Other | Admitting: Family Medicine

## 2015-02-06 ENCOUNTER — Encounter: Payer: Self-pay | Admitting: Family Medicine

## 2015-02-06 VITALS — BP 120/70 | HR 92 | Temp 98.4°F | Ht 61.42 in | Wt 135.0 lb

## 2015-02-06 DIAGNOSIS — M79671 Pain in right foot: Secondary | ICD-10-CM

## 2015-02-06 NOTE — Patient Instructions (Signed)
Please wear wide supportive footwear  Gentle regular activity is advised  Tylenol 500-1000mg  up to 3 times per day if needed for pain  Follow up with your doctor if symptoms worsen or persist

## 2015-02-06 NOTE — Progress Notes (Signed)
Pre visit review using our clinic review tool, if applicable. No additional management support is needed unless otherwise documented below in the visit note. 

## 2015-02-06 NOTE — Progress Notes (Signed)
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  Past Medical History  Diagnosis Date  . Arthritis   . GERD (gastroesophageal reflux disease)   . Positive TB test   . Osteopenia     Past Surgical History  Procedure Laterality Date  . Cesarean section  1982  . Breast lumpectomy Left 1996    benign  . Wrist fracture surgery Right 2004    Right  . Shoulder arthroscopy Left 2012    left    Family History  Problem Relation Age of Onset  . Hyperlipidemia Father     Social History   Social History  . Marital Status: Married    Spouse Name: N/A  . Number of Children: N/A  . Years of Education: N/A   Social History Main Topics  . Smoking status: Never Smoker   . Smokeless tobacco: Never Used  . Alcohol Use: No  . Drug Use: None  . Sexual Activity: Not Asked   Other Topics Concern  . None   Social History Narrative     Current outpatient prescriptions:  .  alendronate (FOSAMAX) 70 MG tablet, Take 70 mg by mouth once a week. , Disp: , Rfl: 7 .  Ascorbic Acid (VITAMIN C PO), Take by mouth., Disp: , Rfl:  .  aspirin 81 MG tablet, Take 81 mg by mouth daily., Disp: , Rfl:  .  atorvastatin (LIPITOR) 20 MG tablet, Take 20 mg by mouth daily., Disp: , Rfl:  .  B Complex Vitamins (VITAMIN-B COMPLEX PO), Take by mouth., Disp: , Rfl:  .  Calcium Carbonate (CALCIUM 600 PO), Take by mouth., Disp: , Rfl:  .  doxycycline (VIBRAMYCIN) 100 MG capsule, Take 1 capsule (100 mg total) by mouth 2 (two) times daily., Disp: 20 capsule, Rfl: 0 .  meloxicam (MOBIC) 15 MG tablet, Take 15 mg by mouth as needed. , Disp: ,  Rfl: 3 .  Misc Natural Products (GLUCOSAMINE CHONDROITIN MSM) TABS, Take by mouth., Disp: , Rfl:  .  Multiple Vitamin (MULTIVITAMIN) tablet, Take 1 tablet by mouth daily., Disp: , Rfl:  .  pantoprazole (PROTONIX) 40 MG tablet, Take 1 tablet (40 mg total) by mouth daily., Disp: 90 tablet, Rfl: 3 .  RESTASIS 0.05 % ophthalmic emulsion, INT 1 GTT IN OU BID, Disp: , Rfl: 0  EXAM:  Filed Vitals:   02/06/15 1035  BP: 120/70  Pulse: 92  Temp: 98.4 F (36.9 C)    Body mass index is 25.16 kg/(m^2).  GENERAL: vitals reviewed and listed above, alert, oriented, appears well hydrated and in no acute distress  HEENT: atraumatic, conjunttiva clear, no obvious abnormalities on inspection of external nose and ears  NECK: no obvious masses on inspection  MS: moves all extremities without noticeable abnormality Gait is normal, normal inspection of both feet except for high long arch, significant hallux valgus with bilat, wide forefoot, NV intact, no sig bony TTP in area of concern, cap refill normal  PSYCH: pleasant and cooperative, no obvious depression or anxiety  ASSESSMENT AND PLAN:  Discussed the following assessment and plan:  Right foot pain  -she does  have some foot deformities and it is possible that foot wear and these deformities are playing a role -discuss options, decided to try changing foot wear, tylenol as needed, possible podiatry referral if worsening or not imrpoving -Patient advised to return or notify a doctor immediately if symptoms worsen or persist or new concerns arise.  Patient Instructions  Please wear wide supportive footwear  Gentle regular activity is advised  Tylenol 500-1000mg  up to 3 times per day if needed for pain  Follow up with your doctor if symptoms worsen or persist     Claire [name], Claire [name].

## 2015-02-12 ENCOUNTER — Encounter: Payer: Self-pay | Admitting: Family Medicine

## 2015-02-12 ENCOUNTER — Ambulatory Visit: Payer: Medicare Other | Admitting: Family Medicine

## 2015-02-12 ENCOUNTER — Ambulatory Visit (INDEPENDENT_AMBULATORY_CARE_PROVIDER_SITE_OTHER): Payer: Medicare Other | Admitting: Family Medicine

## 2015-02-12 DIAGNOSIS — R300 Dysuria: Secondary | ICD-10-CM | POA: Diagnosis not present

## 2015-02-12 LAB — POCT URINALYSIS DIPSTICK
BILIRUBIN UA: NEGATIVE
GLUCOSE UA: NEGATIVE
KETONES UA: NEGATIVE
Nitrite, UA: NEGATIVE
Protein, UA: NEGATIVE
SPEC GRAV UA: 1.015
Urobilinogen, UA: 0.2
pH, UA: 6

## 2015-02-12 MED ORDER — CIPROFLOXACIN HCL 500 MG PO TABS: 500.0000 mg | ORAL_TABLET | Freq: Two times a day (BID) | ORAL | Status: DC

## 2015-02-12 NOTE — Patient Instructions (Signed)

## 2015-02-12 NOTE — Progress Notes (Signed)
   Subjective:    Patient ID: Claire [name], female    DOB: [birth year], 64 y.o.   MRN: [id number]  HPI Possible UTI. Patient developed symptoms about 3 days ago of urine frequency and burning with urination. Denies any fevers or chills. No flank pain. No nausea or vomiting. No suprapubic discomfort. No gross hematuria. Allergy to penicillin.  Past Medical History  Diagnosis Date  . Arthritis   . GERD (gastroesophageal reflux disease)   . Positive TB test   . Osteopenia    Past Surgical History  Procedure Laterality Date  . Cesarean section  1982  . Breast lumpectomy Left 1996    benign  . Wrist fracture surgery Right 2004    Right  . Shoulder arthroscopy Left 2012    left    reports that she has never smoked. She has never used smokeless tobacco. She reports that she does not drink alcohol. Her drug history is not on file. family history includes Hyperlipidemia in her father. Allergies  Allergen Reactions  . Penicillins       Review of Systems  Constitutional: Negative for fever, chills and appetite change.  Gastrointestinal: Negative for nausea, vomiting, abdominal pain, diarrhea and constipation.  Genitourinary: Positive for dysuria and frequency. Negative for hematuria.  Musculoskeletal: Negative for back pain.  Neurological: Negative for dizziness.       Objective:   Physical Exam  Constitutional: She appears well-developed and well-nourished.  Cardiovascular: Normal rate and regular rhythm.   Pulmonary/Chest: Effort normal and breath sounds normal. No respiratory distress. She has no wheezes. She has no rales.          Assessment & Plan:  Probable uncomplicated cystitis. Push fluids. Urine culture sent. Cipro 500 mg twice a day for 3 days.

## 2015-02-12 NOTE — Progress Notes (Signed)
Pre visit review using our clinic review tool, if applicable. No additional management support is needed unless otherwise documented below in the visit note. 

## 2015-02-14 LAB — URINE CULTURE

## 2015-04-16 ENCOUNTER — Ambulatory Visit (INDEPENDENT_AMBULATORY_CARE_PROVIDER_SITE_OTHER): Payer: Medicare Other | Admitting: Family Medicine

## 2015-04-16 VITALS — BP 110/80 | HR 69 | Temp 98.5°F | Ht 61.0 in | Wt 135.6 lb

## 2015-04-16 DIAGNOSIS — R5383 Other fatigue: Secondary | ICD-10-CM

## 2015-04-16 DIAGNOSIS — R7989 Other specified abnormal findings of blood chemistry: Secondary | ICD-10-CM

## 2015-04-16 LAB — TSH: TSH: 4.05 u[IU]/mL (ref 0.35–4.50)

## 2015-04-16 NOTE — Progress Notes (Signed)
   Subjective:    Patient ID: Claire [name], female    DOB: Jan 21, 1951, 65 y.o.   MRN: [id number]  HPI   patient here for follow-up abnormal TSH. TSH 4.66 6 months ago. She has had some increased fatigue recently. She sometimes alternates between hot and cold sensation. No hair loss. No constipation. No significant weight changes.   She has GERD with occasional breakthrough symptoms. Usually relieved with acid suppressors. No dysphagia.  Past Medical History  Diagnosis Date  . Arthritis   . GERD (gastroesophageal reflux disease)   . Positive TB test   . Osteopenia    Past Surgical History  Procedure Laterality Date  . Cesarean section  1982  . Breast lumpectomy Left 1996    benign  . Wrist fracture surgery Right 2004    Right  . Shoulder arthroscopy Left 2012    left    reports that she has never smoked. She has never used smokeless tobacco. She reports that she does not drink alcohol. Her drug history is not on file. family history includes Hyperlipidemia in her father. Allergies  Allergen Reactions  . Penicillins      Review of Systems  Constitutional: Positive for fatigue. Negative for appetite change and unexpected weight change.  Respiratory: Negative for shortness of breath.   Cardiovascular: Negative for chest pain.  Endocrine: Positive for cold intolerance and heat intolerance.       Objective:   Physical Exam  Constitutional: She appears well-developed and well-nourished.  Neck: Neck supple. No thyromegaly present.  Cardiovascular: Normal rate and regular rhythm.   Pulmonary/Chest: Effort normal and breath sounds normal. No respiratory distress. She has no wheezes. She has no rales.  Musculoskeletal: She exhibits no edema.          Assessment & Plan:   Abnormal TSH with recent slightly elevated TSH. She has nonspecific symptoms of fatigue. Weight stable. Recheck TSH. Consider replacement if elevating

## 2015-04-16 NOTE — Progress Notes (Signed)
Pre visit review using our clinic review tool, if applicable. No additional management support is needed unless otherwise documented below in the visit note. 

## 2015-07-03 ENCOUNTER — Ambulatory Visit (INDEPENDENT_AMBULATORY_CARE_PROVIDER_SITE_OTHER): Payer: Medicare Other | Admitting: Family Medicine

## 2015-07-03 VITALS — BP 110/80 | HR 79 | Temp 97.7°F | Ht 61.0 in | Wt 130.0 lb

## 2015-07-03 DIAGNOSIS — K219 Gastro-esophageal reflux disease without esophagitis: Secondary | ICD-10-CM | POA: Diagnosis not present

## 2015-07-03 DIAGNOSIS — Z7189 Other specified counseling: Secondary | ICD-10-CM

## 2015-07-03 DIAGNOSIS — Z7184 Encounter for health counseling related to travel: Secondary | ICD-10-CM

## 2015-07-03 MED ORDER — PANTOPRAZOLE SODIUM 40 MG PO TBEC: 40.0000 mg | DELAYED_RELEASE_TABLET | Freq: Every day | ORAL | Status: DC

## 2015-07-03 MED ORDER — CIPROFLOXACIN HCL 500 MG PO TABS: ORAL_TABLET | ORAL | Status: DC

## 2015-07-03 NOTE — Patient Instructions (Signed)
Food Choices for Gastroesophageal Reflux Disease, Adult When you have gastroesophageal reflux disease (GERD), the foods you eat and your eating habits are very important. Choosing the right foods can help ease the discomfort of GERD. WHAT GENERAL GUIDELINES DO I NEED TO FOLLOW?  Choose fruits, vegetables, whole grains, low-fat dairy products, and low-fat meat, fish, and poultry.  Limit fats such as oils, salad dressings, butter, nuts, and avocado.  Keep a food diary to identify foods that cause symptoms.  Avoid foods that cause reflux. These may be different for different people.  Eat frequent small meals instead of three large meals each day.  Eat your meals slowly, in a relaxed setting.  Limit fried foods.  Cook foods using methods other than frying.  Avoid drinking alcohol.  Avoid drinking large amounts of liquids with your meals.  Avoid bending over or lying down until 2-3 hours after eating. WHAT FOODS ARE NOT RECOMMENDED? The following are some foods and drinks that may worsen your symptoms: Vegetables Tomatoes. Tomato juice. Tomato and spaghetti sauce. Chili peppers. Onion and garlic. Horseradish. Fruits Oranges, grapefruit, and lemon (fruit and juice). Meats High-fat meats, fish, and poultry. This includes hot dogs, ribs, ham, sausage, salami, and bacon. Dairy Whole milk and chocolate milk. Sour cream. Cream. Butter. Ice cream. Cream cheese.  Beverages Coffee and tea, with or without caffeine. Carbonated beverages or energy drinks. Condiments Hot sauce. Barbecue sauce.  Sweets/Desserts Chocolate and cocoa. Donuts. Peppermint and spearmint. Fats and Oils High-fat foods, including [place] fries and potato chips. Other Vinegar. Strong spices, such as black pepper, white pepper, red pepper, cayenne, curry powder, cloves, ginger, and chili powder. The items listed above may not be a complete list of foods and beverages to avoid. Contact your dietitian for more  information.   This information is not intended to replace advice given to you by your health care provider. Make sure you discuss any questions you have with your health care provider.   Document Released: 03/17/2005 Document Revised: 04/07/2014 Document Reviewed: 01/19/2013 Elsevier Interactive Patient Education 2016 [employer].  Consider elevate head of bed about 6 inches Get back on Protonix 40 mg once daily May supplement with Pepcid as needed. Please let me know if not better in 3-4 weeks. May taper back off Protonix in 6-8 weeks.

## 2015-07-03 NOTE — Progress Notes (Signed)
Pre visit review using our clinic review tool, if applicable. No additional management support is needed unless otherwise documented below in the visit note. 

## 2015-07-03 NOTE — Progress Notes (Signed)
   Subjective:    Patient ID: Claire [name], female    DOB: 02-21-1951, 66 y.o.   MRN: [id number]  HPI Patient is here with GI symptoms. She has long history of reflux and recently had some increased acidity and heartburn. Symptoms tend to be worse at night. She has consciously reduced her overall intake somewhat but still has good appetite. She denies any dysphagia.  Symptoms occasionally waken her at night. She's tried TUMS and Pepcid with minimal relief. She has Protonix but usually does not take this more than 3 days consecutive. She has not elevated head of bed. Denies any cough , hoarseness , or any abdominal pain. No stool changes. Nonsmoker. No alcohol use. No regular nonsteroidal use.  Patient has upcoming mission trip this summer to [place] She is requesting refill of Cipro to have on hand for traveler's diarrhea  Past Medical History  Diagnosis Date  . Arthritis   . GERD (gastroesophageal reflux disease)   . Positive TB test   . Osteopenia    Past Surgical History  Procedure Laterality Date  . Cesarean section  1982  . Breast lumpectomy Left 1996    benign  . Wrist fracture surgery Right 2004    Right  . Shoulder arthroscopy Left 2012    left    reports that she has never smoked. She has never used smokeless tobacco. She reports that she does not drink alcohol. Her drug history is not on file. family history includes Hyperlipidemia in her father. Allergies  Allergen Reactions  . Penicillins       Review of Systems  Constitutional: Negative for fever, chills, appetite change, fatigue and unexpected weight change.  HENT: Negative for trouble swallowing.   Respiratory: Negative for cough and shortness of breath.   Cardiovascular: Negative for chest pain.  Gastrointestinal: Negative for nausea, vomiting, abdominal pain, diarrhea, constipation, blood in stool and abdominal distention.  Genitourinary: Negative for dysuria.  Neurological: Negative for  dizziness.       Objective:   Physical Exam  Constitutional: She appears well-developed and well-nourished.  HENT:  Mouth/Throat: Oropharynx is clear and moist.  Neck: Neck supple. No thyromegaly present.  Cardiovascular: Normal rate and regular rhythm.   Pulmonary/Chest: Effort normal and breath sounds normal. No respiratory distress. She has no wheezes. She has no rales.  Abdominal: Soft. Bowel sounds are normal. She exhibits no distension and no mass. There is no tenderness. There is no rebound and no guarding.          Assessment & Plan:   GERD. She denies any red flags such as appetite or weight changes or any dysphagia. Get back on regular use of Protonix 40 mg daily. May supplement with Pepcid as needed. Elevate head of bed 6-8 inches. Handout on dietary modification given. Touch base in 3-4 weeks if symptoms not resolving.   Travel advice encounter. Patient requesting refill Cipro to have on hand for as needed use for any traveler's diarrhea. Prescription given

## 2015-08-04 ENCOUNTER — Other Ambulatory Visit: Payer: Self-pay | Admitting: Family Medicine

## 2015-08-31 ENCOUNTER — Ambulatory Visit (INDEPENDENT_AMBULATORY_CARE_PROVIDER_SITE_OTHER): Payer: Medicare Other | Admitting: Family Medicine

## 2015-08-31 VITALS — BP 130/92 | HR 76 | Temp 98.4°F | Ht 61.0 in | Wt 129.0 lb

## 2015-08-31 DIAGNOSIS — R1012 Left upper quadrant pain: Secondary | ICD-10-CM

## 2015-08-31 LAB — CBC WITH DIFFERENTIAL/PLATELET
Basophils Absolute: 45 cells/uL (ref 0–200)
Basophils Relative: 1 %
Eosinophils Absolute: 360 cells/uL (ref 15–500)
Eosinophils Relative: 8 %
HEMATOCRIT: 38.2 % (ref 35.0–45.0)
HEMOGLOBIN: 12.5 g/dL (ref 11.7–15.5)
Lymphocytes Relative: 36 %
Lymphs Abs: 1620 cells/uL (ref 850–3900)
MCH: 30 pg (ref 27.0–33.0)
MCHC: 32.7 g/dL (ref 32.0–36.0)
MCV: 91.6 fL (ref 80.0–100.0)
MPV: 9.5 fL (ref 7.5–12.5)
Monocytes Absolute: 360 cells/uL (ref 200–950)
Monocytes Relative: 8 %
NEUTROS ABS: 2115 {cells}/uL (ref 1500–7800)
NEUTROS PCT: 47 %
Platelets: 242 10*3/uL (ref 140–400)
RBC: 4.17 MIL/uL (ref 3.80–5.10)
RDW: 13.3 % (ref 11.0–15.0)
WBC: 4.5 10*3/uL (ref 3.8–10.8)

## 2015-08-31 LAB — HEPATIC FUNCTION PANEL
ALT: 24 U/L (ref 6–29)
AST: 22 U/L (ref 10–35)
Albumin: 4.3 g/dL (ref 3.6–5.1)
Alkaline Phosphatase: 56 U/L (ref 33–130)
BILIRUBIN DIRECT: 0.2 mg/dL (ref <=0.2)
Indirect Bilirubin: 0.7 mg/dL (ref 0.2–1.2)
Total Bilirubin: 0.9 mg/dL (ref 0.2–1.2)
Total Protein: 6.9 g/dL (ref 6.1–8.1)

## 2015-08-31 LAB — LIPASE: Lipase: 6 U/L — ABNORMAL LOW (ref 7–60)

## 2015-08-31 NOTE — Patient Instructions (Signed)
Abdominal Pain, Adult Many things can cause abdominal pain. Usually, abdominal pain is not caused by a disease and will improve without treatment. It can often be observed and treated at home. Your health care provider will do a physical exam and possibly order blood tests and X-rays to help determine the seriousness of your pain. However, in many cases, more time must pass before a clear cause of the pain can be found. Before that point, your health care provider may not know if you need more testing or further treatment. HOME CARE INSTRUCTIONS Monitor your abdominal pain for any changes. The following actions may help to alleviate any discomfort you are experiencing:  Only take over-the-counter or prescription medicines as directed by your health care provider.  Do not take laxatives unless directed to do so by your health care provider.  Try a clear liquid diet (broth, tea, or water) as directed by your health care provider. Slowly move to a bland diet as tolerated. SEEK MEDICAL CARE IF:  You have unexplained abdominal pain.  You have abdominal pain associated with nausea or diarrhea.  You have pain when you urinate or have a bowel movement.  You experience abdominal pain that wakes you in the night.  You have abdominal pain that is worsened or improved by eating food.  You have abdominal pain that is worsened with eating fatty foods.  You have a fever. SEEK IMMEDIATE MEDICAL CARE IF:  Your pain does not go away within 2 hours.  You keep throwing up (vomiting).  Your pain is felt only in portions of the abdomen, such as the right side or the left lower portion of the abdomen.  You pass bloody or black tarry stools. MAKE SURE YOU:  Understand these instructions.  Will watch your condition.  Will get help right away if you are not doing well or get worse.   This information is not intended to replace advice given to you by your health care provider. Make sure you discuss  any questions you have with your health care provider.   Document Released: 12/25/2004 Document Revised: 12/06/2014 Document Reviewed: 11/24/2012 Elsevier Interactive Patient Education 2016 [place] by next week if pain no better.

## 2015-08-31 NOTE — Progress Notes (Signed)
Pre visit review using our clinic review tool, if applicable. No additional management support is needed unless otherwise documented below in the visit note. 

## 2015-08-31 NOTE — Progress Notes (Signed)
   Subjective:    Patient ID: Claire [name], female    DOB: [birth year], 65 y.o.   MRN: [id number]  HPI  Acute visit for abdominal pain.  Recently seen for GERD. She took Protonix with good success. She tapered herself off and is now having some recurrent GERD symptoms. She feels this pain is somewhat different.  Location is left upper quadrant.  Severity is mild generally about 3 out of 10.  She's had occasional slight nausea but no vomiting. No stool changes.  No melena. No appetite or weight changes.  No clear exacerbating features. No alleviating features.  Not related to movement.  No cough or shortness of breath.  Denies dysphagia, melena, dyspnea, cough.  Past Medical History  Diagnosis Date  . Arthritis   . GERD (gastroesophageal reflux disease)   . Positive TB test   . Osteopenia    Past Surgical History  Procedure Laterality Date  . Cesarean section  1982  . Breast lumpectomy Left 1996    benign  . Wrist fracture surgery Right 2004    Right  . Shoulder arthroscopy Left 2012    left    reports that she has never smoked. She has never used smokeless tobacco. She reports that she does not drink alcohol. Her drug history is not on file. family history includes Hyperlipidemia in her father. Allergies  Allergen Reactions  . Penicillins       Review of Systems  Constitutional: Negative for fever and chills.  Respiratory: Negative for cough and shortness of breath.   Gastrointestinal: Positive for nausea and abdominal pain. Negative for vomiting, diarrhea, blood in stool and abdominal distention.  Genitourinary: Negative for dysuria, hematuria and flank pain.  Musculoskeletal: Negative for back pain.  Skin: Negative for rash.  Neurological: Negative for dizziness.  Hematological: Negative for adenopathy.       Objective:   Physical Exam  Constitutional: She appears well-developed and well-nourished.  Neck: Neck supple. No thyromegaly present.    Cardiovascular: Normal rate and regular rhythm.   Pulmonary/Chest: Effort normal and breath sounds normal. No respiratory distress. She has no wheezes. She has no rales.  Abdominal: Soft. Bowel sounds are normal. She exhibits no distension and no mass. There is no tenderness. There is no rebound and no guarding.  Skin: No rash noted.          Assessment & Plan:  Vague left upper quadrant abdominal pain. Not reproducible on exam. Nonfocal exam. Check CBC, hepatic panel, lipase. She has had some recent GERD but these symptoms sound be different. Consider abdominal ultrasound if symptoms not resolving by next week and sooner for any fever or worsening symptoms  Eulas Post MD [facility]

## 2015-09-26 ENCOUNTER — Ambulatory Visit (HOSPITAL_COMMUNITY)
Admission: EM | Admit: 2015-09-26 | Discharge: 2015-09-26 | Disposition: A | Discharge status: Home / Self Care | Payer: Medicare Other | Attending: Family Medicine | Admitting: Family Medicine

## 2015-09-26 ENCOUNTER — Encounter (HOSPITAL_COMMUNITY): Payer: Self-pay | Admitting: Emergency Medicine

## 2015-09-26 DIAGNOSIS — S91312A Laceration without foreign body, left foot, initial encounter: Secondary | ICD-10-CM

## 2015-09-26 MED ORDER — HYDROCODONE-ACETAMINOPHEN 5-325 MG PO TABS: 2.0000 | ORAL_TABLET | ORAL | Status: DC | PRN

## 2015-09-26 MED ORDER — LIDOCAINE HCL 2 % IJ SOLN
INTRAMUSCULAR | Status: AC
  Filled 2015-09-26: qty 20

## 2015-09-26 NOTE — ED Notes (Signed)
The patient presented to the [facility] with a complaint of a laceration to the top of her left foot secondary to dropping a knife and it puncturing her foot.

## 2015-09-26 NOTE — ED Provider Notes (Signed)
CSN: [id number]     Arrival date & time 09/26/15  1809 History   None    Chief Complaint  Patient presents with  . Extremity Laceration   (Consider location/radiation/quality/duration/timing/severity/associated sxs/prior Treatment) Patient is a 65 y.o. female presenting with skin laceration.  Laceration Location:  Foot Foot laceration location:  Sole of L foot Depth:  Cutaneous Quality: straight   Bleeding: venous   Time since incident:  2 hours Laceration mechanism:  Knife Pain details:    Quality:  Aching and burning   Severity:  Severe   Timing:  Constant   Progression:  Unchanged Foreign body present:  No foreign bodies Relieved by:  Nothing Worsened by:  Nothing tried Ineffective treatments:  None tried   Past Medical History  Diagnosis Date  . Arthritis   . GERD (gastroesophageal reflux disease)   . Positive TB test   . Osteopenia    Past Surgical History  Procedure Laterality Date  . Cesarean section  1982  . Breast lumpectomy Left 1996    benign  . Wrist fracture surgery Right 2004    Right  . Shoulder arthroscopy Left 2012    left   Family History  Problem Relation Age of Onset  . Hyperlipidemia Father    Social History  Substance Use Topics  . Smoking status: Never Smoker   . Smokeless tobacco: Never Used  . Alcohol Use: No   OB History    No data available     Review of Systems  Constitutional: Negative.   HENT: Negative.   Eyes: Negative.   Respiratory: Negative.   Cardiovascular: Negative.   Gastrointestinal: Negative.   Endocrine: Negative.   Genitourinary: Negative.   Musculoskeletal: Negative.   Skin: Positive for wound.  Allergic/Immunologic: Negative.   Neurological: Negative.   Hematological: Negative.   Psychiatric/Behavioral: Negative.     Allergies  Penicillins  Home Medications   Prior to Admission medications   Medication Sig Start Date End Date Taking? Authorizing Provider  Ascorbic Acid (VITAMIN C PO) Take by  mouth.   Yes Historical Provider, MD  aspirin 81 MG tablet Take 81 mg by mouth daily.   Yes Historical Provider, MD  atorvastatin (LIPITOR) 20 MG tablet Take 20 mg by mouth daily.   Yes Historical Provider, MD  B Complex Vitamins (VITAMIN-B COMPLEX PO) Take by mouth.   Yes Historical Provider, MD  Calcium Carbonate (CALCIUM 600 PO) Take by mouth.   Yes Historical Provider, MD  Multiple Vitamin (MULTIVITAMIN) tablet Take 1 tablet by mouth daily.   Yes Historical Provider, MD  pantoprazole (PROTONIX) 40 MG tablet TAKE 1 TABLET(40 MG) BY MOUTH DAILY 08/06/15  Yes Eulas Post, MD  calcium carbonate (TUMS - DOSED IN MG ELEMENTAL CALCIUM) 500 MG chewable tablet Chew 1 tablet by mouth daily. As needed    Historical Provider, MD  famotidine (PEPCID) 20 MG tablet Take 20 mg by mouth daily. As needed    Historical Provider, MD  meloxicam (MOBIC) 15 MG tablet Take 15 mg by mouth as needed.  05/24/14   Historical Provider, MD  Misc Natural Products ([place] MSM) TABS Take by mouth.    Historical Provider, MD  RESTASIS 0.05 % ophthalmic emulsion INT 1 GTT IN OU BID 09/20/14   Historical Provider, MD   Meds Ordered and Administered this Visit  Medications - No data to display  BP 137/75 mmHg  Pulse 74  Temp(Src) 98.5 F (36.9 C) (Oral)  Resp 18  SpO2 96% No  data found.   Physical Exam  Constitutional: She appears well-developed and well-nourished.  HENT:  Head: Normocephalic.  Right Ear: External ear normal.  Left Ear: External ear normal.  Mouth/Throat: Oropharynx is clear and moist.  Eyes: Conjunctivae and EOM are normal. Pupils are equal, round, and reactive to light.  Neck: Normal range of motion. Neck supple.  Cardiovascular: Normal rate, regular rhythm and normal heart sounds.   Pulmonary/Chest: Effort normal and breath sounds normal.  Skin:  1 cm laceration left dorsum of foot inbetween 4th and 5th metatarsal.  No tendon involvement and normal neurovascular check.     ED Course  .Marland KitchenLaceration Repair Date/Time: 09/26/2015 8:34 PM Performed by: Lysbeth Penner Authorized by: Lysbeth Penner Consent: Verbal consent obtained. Written consent obtained. Consent given by: patient Patient understanding: patient states understanding of the procedure being performed Patient consent: the patient's understanding of the procedure matches consent given Procedure consent: procedure consent matches procedure scheduled Relevant documents: relevant documents present and verified Test results: test results not available Site marked: the operative site was not marked Imaging studies: imaging studies available Patient identity confirmed: verbally with patient Time out: Immediately prior to procedure a "time out" was called to verify the correct patient, procedure, equipment, support staff and site/side marked as required. Body area: lower extremity Location details: left foot Laceration length: 1 cm Tendon involvement: none Nerve involvement: none Vascular damage: no Anesthesia: local infiltration Local anesthetic: lidocaine 1% without epinephrine Anesthetic total: 3 ml Patient sedated: no Preparation: Patient was prepped and draped in the usual sterile fashion. Irrigation solution: saline Irrigation method: syringe Amount of cleaning: standard Debridement: none Degree of undermining: none Skin closure: 4-0 Prolene Number of sutures: 3 Technique: simple Approximation: close Approximation difficulty: simple Dressing: 4x4 sterile gauze   (including critical care time)  Labs Review Labs Reviewed - No data to display  Imaging Review No results found.   Visual Acuity Review  Right Eye Distance:   Left Eye Distance:   Bilateral Distance:    Right Eye Near:   Left Eye Near:    Bilateral Near:         MDM  Left foot laceration - wound closed with 3 sutures. Norco 5mg  / 325mg  one po q 6hours #6 Follow up prn for infection Follow up in  7-10 days for suture removal.    Lysbeth Penner, FNP 09/26/15 2036

## 2015-09-26 NOTE — Discharge Instructions (Signed)
Laceration Care, Adult  A laceration is a cut that goes through all layers of the skin. The cut also goes into the tissue that is right under the skin. Some cuts heal on their own. Others need to be closed with stitches (sutures), staples, skin adhesive strips, or wound glue. Taking care of your cut lowers your risk of infection and helps your cut to heal better.  HOW TO TAKE CARE OF YOUR CUT  For stitches or staples:  · Keep the wound clean and dry.  · If you were given a bandage (dressing), you should change it at least one time per day or as told by your doctor. You should also change it if it gets wet or dirty.  · Keep the wound completely dry for the first 24 hours or as told by your doctor. After that time, you may take a shower or a bath. However, make sure that the wound is not soaked in water until after the stitches or staples have been removed.  · Clean the wound one time each day or as told by your doctor:    Wash the wound with soap and water.    Rinse the wound with water until all of the soap comes off.    Pat the wound dry with a clean towel. Do not rub the wound.  · After you clean the wound, put a thin layer of antibiotic ointment on it as told by your doctor. This ointment:    Helps to prevent infection.    Keeps the bandage from sticking to the wound.  · Have your stitches or staples removed as told by your doctor.  If your doctor used skin adhesive strips:   · Keep the wound clean and dry.  · If you were given a bandage, you should change it at least one time per day or as told by your doctor. You should also change it if it gets dirty or wet.  · Do not get the skin adhesive strips wet. You can take a shower or a bath, but be careful to keep the wound dry.  · If the wound gets wet, pat it dry with a clean towel. Do not rub the wound.  · Skin adhesive strips fall off on their own. You can trim the strips as the wound heals. Do not remove any strips that are still stuck to the wound. They will  fall off after a while.  If your doctor used wound glue:  · Try to keep your wound dry, but you may briefly wet it in the shower or bath. Do not soak the wound in water, such as by swimming.  · After you take a shower or a bath, gently pat the wound dry with a clean towel. Do not rub the wound.  · Do not do any activities that will make you really sweaty until the skin glue has fallen off on its own.  · Do not apply liquid, cream, or ointment medicine to your wound while the skin glue is still on.  · If you were given a bandage, you should change it at least one time per day or as told by your doctor. You should also change it if it gets dirty or wet.  · If a bandage is placed over the wound, do not let the tape for the bandage touch the skin glue.  · Do not pick at the glue. The skin glue usually stays on for 5-10 days. Then, it   falls off of the skin.  General Instructions   · To help prevent scarring, make sure to cover your wound with sunscreen whenever you are outside after stitches are removed, after adhesive strips are removed, or when wound glue stays in place and the wound is healed. Make sure to wear a sunscreen of at least 30 SPF.  · Take over-the-counter and prescription medicines only as told by your doctor.  · If you were given antibiotic medicine or ointment, take or apply it as told by your doctor. Do not stop using the antibiotic even if your wound is getting better.  · Do not scratch or pick at the wound.  · Keep all follow-up visits as told by your doctor. This is important.  · Check your wound every day for signs of infection. Watch for:    Redness, swelling, or pain.    Fluid, blood, or pus.  · Raise (elevate) the injured area above the level of your heart while you are sitting or lying down, if possible.  GET HELP IF:  · You got a tetanus shot and you have any of these problems at the injection site:    Swelling.    Very bad pain.    Redness.    Bleeding.  · You have a fever.  · A wound that was  closed breaks open.  · You notice a bad smell coming from your wound or your bandage.  · You notice something coming out of the wound, such as wood or glass.  · Medicine does not help your pain.  · You have more redness, swelling, or pain at the site of your wound.  · You have fluid, blood, or pus coming from your wound.  · You notice a change in the color of your skin near your wound.  · You need to change the bandage often because fluid, blood, or pus is coming from the wound.  · You start to have a new rash.  · You start to have numbness around the wound.  GET HELP RIGHT AWAY IF:  · You have very bad swelling around the wound.  · Your pain suddenly gets worse and is very bad.  · You notice painful lumps near the wound or on skin that is anywhere on your body.  · You have a red streak going away from your wound.  · The wound is on your hand or foot and you cannot move a finger or toe like you usually can.  · The wound is on your hand or foot and you notice that your fingers or toes look pale or bluish.     This information is not intended to replace advice given to you by your health care provider. Make sure you discuss any questions you have with your health care provider.     Document Released: 09/03/2007 Document Revised: 08/01/2014 Document Reviewed: 03/13/2014  Elsevier Interactive Patient Education ©2016 [employer]

## 2015-10-05 ENCOUNTER — Ambulatory Visit (INDEPENDENT_AMBULATORY_CARE_PROVIDER_SITE_OTHER): Payer: Medicare Other | Admitting: Family Medicine

## 2015-10-05 ENCOUNTER — Encounter: Payer: Self-pay | Admitting: Family Medicine

## 2015-10-05 VITALS — BP 110/72 | HR 64 | Temp 98.4°F | Ht 61.0 in | Wt 127.8 lb

## 2015-10-05 DIAGNOSIS — M21619 Bunion of unspecified foot: Secondary | ICD-10-CM | POA: Diagnosis not present

## 2015-10-05 DIAGNOSIS — E785 Hyperlipidemia, unspecified: Secondary | ICD-10-CM | POA: Diagnosis not present

## 2015-10-05 DIAGNOSIS — S91312D Laceration without foreign body, left foot, subsequent encounter: Secondary | ICD-10-CM

## 2015-10-05 DIAGNOSIS — M8580 Other specified disorders of bone density and structure, unspecified site: Secondary | ICD-10-CM

## 2015-10-05 DIAGNOSIS — Z0000 Encounter for general adult medical examination without abnormal findings: Secondary | ICD-10-CM

## 2015-10-05 LAB — LIPID PANEL
CHOL/HDL RATIO: 3
Cholesterol: 173 mg/dL (ref 0–200)
HDL: 51.7 mg/dL (ref >39.00)
LDL Cholesterol: 101 mg/dL — ABNORMAL HIGH (ref 0–99)
NONHDL: 120.81
Triglycerides: 99 mg/dL (ref 0.0–149.0)
VLDL: 19.8 mg/dL (ref 0.0–40.0)

## 2015-10-05 NOTE — Progress Notes (Signed)
Pre visit review using our clinic review tool, if applicable. No additional management support is needed unless otherwise documented below in the visit note. 

## 2015-10-05 NOTE — Patient Instructions (Signed)
Return for Pneumovax at your convenience Consider elevated head of bed about 6 inches Confirm date of last colonoscopy.

## 2015-10-05 NOTE — Progress Notes (Signed)
Subjective:    Patient ID: Claire [name], female    DOB: [birth year], 65 y.o.   MRN: [id number]  HPI Patient seen for Medicare subsequent annual wellness visit and medical follow-up for issues below.  Recent laceration left foot. Occurred 9 days ago. Went to urgent care. 3 sutures placed. Last tetanus 2013. She's had some mild itching. No redness. No fevers or chills. No pain. Very mild paresthesias dorsum of the foot. No evidence for tendon injury . Full range of motion all digits  Hyperlipidemia. Treated with Lipitor. No myalgias. She had recent upper GI symptoms following trip to [place] Her labs were unremarkable.  Those symptoms have fully resolved at this time.  She walks for exercise. No chest pains. No dyspnea. No dizziness. DEXA scan August 2016 osteopenia. She takes calcium and vitamin D.  No hx of recent fracture. She has bilateral bunions, left greater than right. For the most part, these are not painful. She sees gynecologist yearly. She is getting yearly mammograms. She had Prevnar 13 last year. She declines Pneumovax today. Previous shingles vaccine.  Past Medical History  Diagnosis Date  . Arthritis   . GERD (gastroesophageal reflux disease)   . Positive TB test   . Osteopenia    Past Surgical History  Procedure Laterality Date  . Cesarean section  1982  . Breast lumpectomy Left 1996    benign  . Wrist fracture surgery Right 2004    Right  . Shoulder arthroscopy Left 2012    left    reports that she has never smoked. She has never used smokeless tobacco. She reports that she does not drink alcohol. Her drug history is not on file. family history includes Hyperlipidemia in her father; Lymphoma in her brother. Allergies  Allergen Reactions  . Penicillins    1.  Risk factors based on Past Medical , Social, and Family history reviewed and as indicated above with no changes 2.  Limitations in physical activities None.  No recent falls. 3.   Depression/mood No active depression or anxiety issues 4.  Hearing No defiits 5.  ADLs independent in all. 6.  Cognitive function (orientation to time and place, language, writing, speech,memory) no short or long term memory issues.  Language and judgement intact. 7.  Home Safety no issues 8.  Height, weight, and visual acuity.all stable. 9.  Counseling discussed regular weightbearing exercise 10. Recommendation of preventive services. 11. Labs based on risk factors- lipid panel 12. Care Plan as above 13. Other Providers Dr Carren Rang (gyn) retired and has not yet established with another.  Dr Buccini-GI 14. Written schedule of screening/prevention services given to patient.    Review of Systems  Constitutional: Negative for fever, activity change, appetite change, fatigue and unexpected weight change.  HENT: Negative for ear pain, hearing loss, sore throat and trouble swallowing.   Eyes: Negative for visual disturbance.  Respiratory: Negative for cough and shortness of breath.   Cardiovascular: Negative for chest pain and palpitations.  Gastrointestinal: Negative for abdominal pain, diarrhea, constipation and blood in stool.  Genitourinary: Negative for dysuria and hematuria.  Musculoskeletal: Positive for arthralgias. Negative for myalgias and back pain.  Skin: Negative for rash.  Neurological: Negative for dizziness, syncope and headaches.  Hematological: Negative for adenopathy.  Psychiatric/Behavioral: Negative for confusion and dysphoric mood.       Objective:   Physical Exam  Constitutional: She is oriented to person, place, and time. She appears well-developed and well-nourished.  HENT:  Head: Normocephalic  and atraumatic.  Eyes: EOM are normal. Pupils are equal, round, and reactive to light.  Neck: Normal range of motion. Neck supple. No thyromegaly present.  Cardiovascular: Normal rate, regular rhythm and normal heart sounds.   No murmur heard. Pulmonary/Chest: Breath  sounds normal. No respiratory distress. She has no wheezes. She has no rales.  Abdominal: Soft. Bowel sounds are normal. She exhibits no distension and no mass. There is no tenderness. There is no rebound and no guarding.  Musculoskeletal: Normal range of motion. She exhibits no edema.  She has some nodules involving DIP and PIP joints of several digits of the hand. She has prominent bunion especially left foot. No ulceration.  Lymphadenopathy:    She has no cervical adenopathy.  Neurological: She is alert and oriented to person, place, and time. She displays normal reflexes. No cranial nerve deficit.  Skin: No rash noted.  Laceration dorsum left foot. 3 sutures removed. No signs of secondary infection.  Psychiatric: She has a normal mood and affect. Her behavior is normal. Judgment and thought content normal.          Assessment & Plan:  #1 Medicare subsequent annual wellness visit. Recommend Pneumovax and she declines getting this today but states she would like to return in next few weeks for that. Reminder for yearly flu vaccine. Consider repeat bone density next year. Confirm date of last colonoscopy  #2 hyperlipidemia. Check lipid panel. Continue Lipitor  #3 recent laceration left foot. Seen at urgent care. Sutures removed today.No signs of secondary infection and is healing well  #4 osteopenia. Recommend regular calcium and vitamin D. Repeat bone density in one year  #5 bunion left foot. Mostly asymptomatic. She was given name of local podiatrists if she decides to consult them  Eulas Post MD [facility] at [facility]

## 2015-10-15 ENCOUNTER — Encounter: Payer: Medicare Other | Admitting: Family Medicine

## 2015-10-22 ENCOUNTER — Encounter (INDEPENDENT_AMBULATORY_CARE_PROVIDER_SITE_OTHER): Payer: Self-pay

## 2015-10-22 ENCOUNTER — Ambulatory Visit (INDEPENDENT_AMBULATORY_CARE_PROVIDER_SITE_OTHER): Payer: Medicare Other | Admitting: Family Medicine

## 2015-10-22 DIAGNOSIS — Z23 Encounter for immunization: Secondary | ICD-10-CM

## 2015-10-31 ENCOUNTER — Other Ambulatory Visit: Payer: Self-pay | Admitting: Family Medicine

## 2015-10-31 DIAGNOSIS — Z1231 Encounter for screening mammogram for malignant neoplasm of breast: Secondary | ICD-10-CM

## 2015-11-05 ENCOUNTER — Ambulatory Visit: Payer: Self-pay | Admitting: Podiatry

## 2015-11-05 ENCOUNTER — Encounter: Payer: Self-pay | Admitting: Podiatry

## 2015-11-05 ENCOUNTER — Ambulatory Visit (INDEPENDENT_AMBULATORY_CARE_PROVIDER_SITE_OTHER): Payer: Medicare Other | Admitting: Podiatry

## 2015-11-05 VITALS — BP 131/77 | HR 69 | Ht 62.0 in | Wt 129.0 lb

## 2015-11-05 DIAGNOSIS — M79606 Pain in leg, unspecified: Secondary | ICD-10-CM | POA: Diagnosis not present

## 2015-11-05 DIAGNOSIS — M21619 Bunion of unspecified foot: Secondary | ICD-10-CM

## 2015-11-05 NOTE — Progress Notes (Signed)
SUBJECTIVE: 65 y.o. year old female presents for painful bunions and calluses on both feet. Patient is referred by Dr. Elease Hashimoto.  Patient has recent history of laceration injury on left foot, which was taken care of at an urgent care facility.   REVIEW OF SYSTEMS: A comprehensive review of systems was negative except for: high cholesterol.   OBJECTIVE: DERMATOLOGIC EXAMINATION: Hyperpigmented skin with contracture in between 4th and 5th Metatarsal head area from recent laceration injury. Plantar broad callus under 2nd and 3rd MPJ bilateral.   VASCULAR EXAMINATION OF LOWER LIMBS: Pedal pulses: All pedal pulses are palpable with normal pulsation.  Temperature gradient from tibial crest to dorsum of foot is within normal bilateral.  NEUROLOGIC EXAMINATION OF THE LOWER LIMBS: All epicritic and tactile sensations grossly intact.   MUSCULOSKELETAL EXAMINATION: Low medial longitudinal arch bilateral.  Hallux valgus bilateral. Positive for prominent bunion L<R, with elevated first ray bilateral. Normal forefoot and rearfoot joint motion, normal muscle strength in lower limbs. Flaccid and varus rotating 5th digit left.   ASSESSMENT: 1. Hallux valgus with painful bunion bilateral with lateral weight shifting and callus formation under 2nd and 3rd MPJ bilateral.  2. Scar from old injury causing flaccid 5th digit left.   PLAN: 1. Reviewed clinical findings and available treatment options, surgical and conservative. 2. Reviewed benefit of custom orthotics, proper shoe selection, type of exercise to benefit foot condition.  Patient will return as needed.

## 2015-11-05 NOTE — Patient Instructions (Signed)
Seen for bilateral bunions. Noted of weak and displaced first Metatarsal one with bunion formation. Recommend to use custom made orthotics and well supported tennis shoes and sandals.

## 2015-12-07 ENCOUNTER — Ambulatory Visit (INDEPENDENT_AMBULATORY_CARE_PROVIDER_SITE_OTHER): Payer: Medicare Other | Admitting: Family Medicine

## 2015-12-07 ENCOUNTER — Encounter (INDEPENDENT_AMBULATORY_CARE_PROVIDER_SITE_OTHER): Payer: Self-pay

## 2015-12-07 DIAGNOSIS — Z23 Encounter for immunization: Secondary | ICD-10-CM

## 2015-12-07 DIAGNOSIS — R7309 Other abnormal glucose: Secondary | ICD-10-CM | POA: Diagnosis not present

## 2015-12-07 DIAGNOSIS — R739 Hyperglycemia, unspecified: Secondary | ICD-10-CM

## 2015-12-07 DIAGNOSIS — Z131 Encounter for screening for diabetes mellitus: Secondary | ICD-10-CM

## 2015-12-07 LAB — POCT GLYCOSYLATED HEMOGLOBIN (HGB A1C): Hemoglobin A1C: 6

## 2015-12-07 NOTE — Progress Notes (Signed)
Subjective:     Patient ID: Claire [name], female   DOB: January 12, 1951, 65 y.o.   MRN: [id number]  HPI Patient here with concern for type 2 diabetes. She has no family history. She has had some mild thirst and mild urine frequency but no weight changes. She has home glucose monitor and recently had fasting blood sugars of 158, 124, 120. Never diagnosed with prediabetes or diabetes. Has been exercising less this past few months  Past Medical History:  Diagnosis Date  . Arthritis   . GERD (gastroesophageal reflux disease)   . Osteopenia   . Positive TB test    Past Surgical History:  Procedure Laterality Date  . BREAST LUMPECTOMY Left 1996   benign  . [place]  . SHOULDER ARTHROSCOPY Left 2012   left  . WRIST FRACTURE SURGERY Right 2004   Right    reports that she has never smoked. She has never used smokeless tobacco. She reports that she does not drink alcohol. Her drug history is not on file. family history includes Hyperlipidemia in her father; Lymphoma in her brother. Allergies  Allergen Reactions  . Penicillins      Review of Systems  Constitutional: Negative for fatigue and unexpected weight change.  Eyes: Negative for visual disturbance.  Respiratory: Negative for cough, chest tightness, shortness of breath and wheezing.   Cardiovascular: Negative for chest pain, palpitations and leg swelling.  Endocrine: Positive for polydipsia and polyuria.  Neurological: Negative for dizziness, seizures, syncope, weakness, light-headedness and headaches.       Objective:   Physical Exam  Constitutional: She appears well-developed and well-nourished.  Eyes: Pupils are equal, round, and reactive to light.  Neck: Neck supple. No JVD present. No thyromegaly present.  Cardiovascular: Normal rate and regular rhythm.  Exam reveals no gallop.   Pulmonary/Chest: Effort normal and breath sounds normal. No respiratory distress. She has no wheezes. She has no rales.   Musculoskeletal: She exhibits no edema.  Neurological: She is alert.       Assessment:     Prediabetes/hyperglycemia. A1c today 6.0%    Plan:     -Recommend lifestyle management with increasing exercise and reducing high glycemic foods. -Handout given -Reassess hemoglobin A1c in 4 months -Flu vaccine given  Eulas Post MD [facility] at [facility]

## 2015-12-07 NOTE — Patient Instructions (Signed)
Prediabetes Eating Plan Prediabetes--also called impaired glucose tolerance or impaired fasting glucose--is a condition that causes blood sugar (blood glucose) levels to be higher than normal. Following a healthy diet can help to keep prediabetes under control. It can also help to lower the risk of type 2 diabetes and heart disease, which are increased in people who have prediabetes. Along with regular exercise, a healthy diet:  Promotes weight loss.  Helps to control blood sugar levels.  Helps to improve the way that the body uses insulin. WHAT DO I NEED TO KNOW ABOUT THIS EATING PLAN?  Use the glycemic index (GI) to plan your meals. The index tells you how quickly a food will raise your blood sugar. Choose low-GI foods. These foods take a longer time to raise blood sugar.  Pay close attention to the amount of carbohydrates in the food that you eat. Carbohydrates increase blood sugar levels.  Keep track of how many calories you take in. Eating the right amount of calories will help you to achieve a healthy weight. Losing about 7 percent of your starting weight can help to prevent type 2 diabetes.  You may want to follow a Mediterranean diet. This diet includes a lot of vegetables, lean meats or fish, whole grains, fruits, and healthy oils and fats. WHAT FOODS CAN I EAT? Grains Whole grains, such as whole-wheat or whole-grain breads, crackers, cereals, and pasta. Unsweetened oatmeal. Bulgur. Barley. Quinoa. Brown rice. Corn or whole-wheat flour tortillas or taco shells. Vegetables Lettuce. Spinach. Peas. Beets. Cauliflower. Cabbage. Broccoli. Carrots. Tomatoes. Squash. Eggplant. Herbs. Peppers. Onions. Cucumbers. Brussels sprouts. Fruits Berries. Bananas. Apples. Oranges. Grapes. Papaya. Mango. Pomegranate. Kiwi. Grapefruit. Cherries. Meats and Other Protein Sources Seafood. Lean meats, such as chicken and turkey or lean cuts of pork and beef. Tofu. Eggs. Nuts. Beans. Dairy Low-fat or  fat-free dairy products, such as yogurt, cottage cheese, and cheese. Beverages Water. Tea. Coffee. Sugar-free or diet soda. Seltzer water. Milk. Milk alternatives, such as soy or almond milk. Condiments Mustard. Relish. Low-fat, low-sugar ketchup. Low-fat, low-sugar barbecue sauce. Low-fat or fat-free mayonnaise. Sweets and Desserts Sugar-free or low-fat pudding. Sugar-free or low-fat ice cream and other frozen treats. Fats and Oils Avocado. Walnuts. Olive oil. The items listed above may not be a complete list of recommended foods or beverages. Contact your dietitian for more options.  WHAT FOODS ARE NOT RECOMMENDED? Grains Refined white flour and flour products, such as bread, pasta, snack foods, and cereals. Beverages Sweetened drinks, such as sweet iced tea and soda. Sweets and Desserts Baked goods, such as cake, cupcakes, pastries, cookies, and cheesecake. The items listed above may not be a complete list of foods and beverages to avoid. Contact your dietitian for more information.   This information is not intended to replace advice given to you by your health care provider. Make sure you discuss any questions you have with your health care provider.   Document Released: 08/01/2014 Document Reviewed: 08/01/2014 Elsevier Interactive Patient Education 2016 Elsevier Inc.  

## 2015-12-07 NOTE — Progress Notes (Signed)
Pre visit review using our clinic review tool, if applicable. No additional management support is needed unless otherwise documented below in the visit note. 

## 2015-12-10 ENCOUNTER — Ambulatory Visit: Payer: Medicare Other

## 2015-12-10 ENCOUNTER — Ambulatory Visit
Admission: RE | Admit: 2015-12-10 | Discharge: 2015-12-10 | Disposition: A | Discharge status: Home / Self Care | Payer: Medicare Other | Source: Ambulatory Visit | Attending: Family Medicine | Admitting: Family Medicine

## 2015-12-10 DIAGNOSIS — Z1231 Encounter for screening mammogram for malignant neoplasm of breast: Secondary | ICD-10-CM

## 2015-12-18 DIAGNOSIS — Z124 Encounter for screening for malignant neoplasm of cervix: Secondary | ICD-10-CM | POA: Diagnosis not present

## 2015-12-18 DIAGNOSIS — N952 Postmenopausal atrophic vaginitis: Secondary | ICD-10-CM | POA: Diagnosis not present

## 2015-12-20 DIAGNOSIS — H2513 Age-related nuclear cataract, bilateral: Secondary | ICD-10-CM | POA: Diagnosis not present

## 2015-12-20 DIAGNOSIS — H16223 Keratoconjunctivitis sicca, not specified as Sjogren's, bilateral: Secondary | ICD-10-CM | POA: Diagnosis not present

## 2015-12-20 DIAGNOSIS — R7303 Prediabetes: Secondary | ICD-10-CM | POA: Diagnosis not present

## 2016-01-16 DIAGNOSIS — M25562 Pain in left knee: Secondary | ICD-10-CM | POA: Diagnosis not present

## 2016-02-20 DIAGNOSIS — M25562 Pain in left knee: Secondary | ICD-10-CM | POA: Diagnosis not present

## 2016-03-12 DIAGNOSIS — M25562 Pain in left knee: Secondary | ICD-10-CM | POA: Diagnosis not present

## 2016-03-31 DIAGNOSIS — H269 Unspecified cataract: Secondary | ICD-10-CM

## 2016-03-31 HISTORY — DX: Unspecified cataract: H26.9

## 2016-04-11 ENCOUNTER — Ambulatory Visit (INDEPENDENT_AMBULATORY_CARE_PROVIDER_SITE_OTHER): Payer: Medicare Other | Admitting: Family Medicine

## 2016-04-11 ENCOUNTER — Encounter: Payer: Self-pay | Admitting: Family Medicine

## 2016-04-11 VITALS — BP 120/80

## 2016-04-11 DIAGNOSIS — R739 Hyperglycemia, unspecified: Secondary | ICD-10-CM

## 2016-04-11 DIAGNOSIS — R252 Cramp and spasm: Secondary | ICD-10-CM | POA: Diagnosis not present

## 2016-04-11 DIAGNOSIS — R51 Headache: Secondary | ICD-10-CM | POA: Diagnosis not present

## 2016-04-11 DIAGNOSIS — R519 Headache, unspecified: Secondary | ICD-10-CM

## 2016-04-11 LAB — BASIC METABOLIC PANEL
BUN: 13 mg/dL (ref 6–23)
CO2: 27 meq/L (ref 19–32)
Calcium: 9 mg/dL (ref 8.4–10.5)
Chloride: 104 mEq/L (ref 96–112)
Creatinine, Ser: 0.78 mg/dL (ref 0.40–1.20)
GFR: 78.22 mL/min (ref >60.00)
Glucose, Bld: 107 mg/dL — ABNORMAL HIGH (ref 70–99)
POTASSIUM: 3.8 meq/L (ref 3.5–5.1)
Sodium: 138 mEq/L (ref 135–145)

## 2016-04-11 LAB — HEMOGLOBIN A1C: Hgb A1c MFr Bld: 6 % (ref 4.6–6.5)

## 2016-04-11 LAB — MAGNESIUM: MAGNESIUM: 2.4 mg/dL (ref 1.5–2.5)

## 2016-04-11 NOTE — Progress Notes (Signed)
Subjective:     Patient ID: Claire [name], female   DOB: [birth year], 66 y.o.   MRN: [id number]  HPI Patient here to discuss multiple items  History of hyperglycemia. Last A1c 6.0%. She has been very diligent reducing starches. Appetite and weight stable. No family history of type 2 diabetes.  Frequent bilateral leg cramps. More frequent at night but occasionally daytime. No claudication symptoms. She generally drinks about 2 L of fluid per day. She also takes multivitamin. No diuretic use.  Patient relates several year history of chronic headache intermittently.  These are bilateral occipital headaches which she notices consistently when she gets very hot. She has noted when she pulls a hood over her head this seems to be a consistent trigger. She has discussed this with previous physicians. She's had no progressive headache. No nausea or vomiting. No visual changes. No focal weakness. No speech changes. Headaches have not been progressive and usually are very transient  Past Medical History:  Diagnosis Date  . Arthritis   . GERD (gastroesophageal reflux disease)   . Osteopenia   . Positive TB test    Past Surgical History:  Procedure Laterality Date  . BREAST LUMPECTOMY Left 1996   benign  . [place]  . SHOULDER ARTHROSCOPY Left 2012   left  . WRIST FRACTURE SURGERY Right 2004   Right    reports that she has never smoked. She has never used smokeless tobacco. She reports that she does not drink alcohol. Her drug history is not on file. family history includes Hyperlipidemia in her father; Lymphoma in her brother. Allergies  Allergen Reactions  . Penicillins      Review of Systems  Constitutional: Negative for fatigue and unexpected weight change.  Eyes: Negative for visual disturbance.  Respiratory: Negative for cough, chest tightness, shortness of breath and wheezing.   Cardiovascular: Negative for chest pain, palpitations and leg swelling.  Endocrine:  Negative for polydipsia and polyuria.  Neurological: Positive for headaches. Negative for dizziness, seizures, syncope, weakness and light-headedness.       Objective:   Physical Exam  Constitutional: She is oriented to person, place, and time. She appears well-developed and well-nourished.  Eyes: Pupils are equal, round, and reactive to light.  Neck: Neck supple. No JVD present. No thyromegaly present.  Cardiovascular: Normal rate and regular rhythm.  Exam reveals no gallop.   Pulmonary/Chest: Effort normal and breath sounds normal. No respiratory distress. She has no wheezes. She has no rales.  Musculoskeletal: She exhibits no edema.  Neurological: She is alert and oriented to person, place, and time. She has normal reflexes. No cranial nerve deficit. Coordination normal.  No focal weakness       Assessment:     #1 hyperglycemia  #2 frequent bilateral leg cramps  #3 atypical and somewhat infrequent and chronic occipital headaches-?neuralgic.  She does not have any concerning focal neuro symptoms associated and these have been for years and transient and not progressive.    Plan:     -Recheck hemoglobin A1c -Check magnesium and basic metabolic panel -Stay well-hydrated -Continue multivitamin -Review signs and symptoms for red flags for headache such as exertional quality, vomiting, daily progressive headache, or any focal neurologic concerns  Eulas Post MD [facility] at [facility]

## 2016-04-11 NOTE — Patient Instructions (Signed)
Leg Cramps Introduction Leg cramps occur when a muscle or muscles tighten and you have no control over this tightening (involuntary muscle contraction). Muscle cramps can develop in any muscle, but the most common place is in the calf muscles of the leg. Those cramps can occur during exercise or when you are at rest. Leg cramps are painful, and they may last for a few seconds to a few minutes. Cramps may return several times before they finally stop. Usually, leg cramps are not caused by a serious medical problem. In many cases, the cause is not known. Some common causes include:  Overexertion.  Overuse from repetitive motions, or doing the same thing over and over.  Remaining in a certain position for a long period of time.  Improper preparation, form, or technique while performing a sport or an activity.  Dehydration.  Injury.  Side effects of some medicines.  Abnormally low levels of the salts and ions in your blood (electrolytes), especially potassium and calcium. These levels could be low if you are taking water pills (diuretics) or if you are pregnant. Follow these instructions at home: Watch your condition for any changes. Taking the following actions may help to lessen any discomfort that you are feeling:  Stay well-hydrated. Drink enough fluid to keep your urine clear or pale yellow.  Try massaging, stretching, and relaxing the affected muscle. Do this for several minutes at a time.  For tight or tense muscles, use a warm towel, heating pad, or hot shower water directed to the affected area.  If you are sore or have pain after a cramp, applying ice to the affected area may relieve discomfort.  Put ice in a plastic bag.  Place a towel between your skin and the bag.  Leave the ice on for 20 minutes, 2-3 times per day.  Avoid strenuous exercise for several days if you have been having frequent leg cramps.  Make sure that your diet includes the essential minerals for your  muscles to work normally.  Take medicines only as directed by your health care provider. Contact a health care provider if:  Your leg cramps get more severe or more frequent, or they do not improve over time.  Your foot becomes cold, numb, or blue. This information is not intended to replace advice given to you by your health care provider. Make sure you discuss any questions you have with your health care provider. Document Released: 04/24/2004 Document Revised: 08/23/2015 Document Reviewed: 02/22/2014  2017 Elsevier  

## 2016-04-11 NOTE — Progress Notes (Signed)
Pre visit review using our clinic review tool, if applicable. No additional management support is needed unless otherwise documented below in the visit note. 

## 2016-04-28 DIAGNOSIS — M7502 Adhesive capsulitis of left shoulder: Secondary | ICD-10-CM | POA: Diagnosis not present

## 2016-05-12 DIAGNOSIS — M7502 Adhesive capsulitis of left shoulder: Secondary | ICD-10-CM | POA: Diagnosis not present

## 2016-05-13 DIAGNOSIS — M25512 Pain in left shoulder: Secondary | ICD-10-CM | POA: Diagnosis not present

## 2016-05-19 DIAGNOSIS — M19011 Primary osteoarthritis, right shoulder: Secondary | ICD-10-CM | POA: Diagnosis not present

## 2016-07-10 DIAGNOSIS — M19011 Primary osteoarthritis, right shoulder: Secondary | ICD-10-CM | POA: Diagnosis not present

## 2016-10-07 ENCOUNTER — Encounter: Payer: Self-pay | Admitting: Family Medicine

## 2016-10-07 ENCOUNTER — Ambulatory Visit (INDEPENDENT_AMBULATORY_CARE_PROVIDER_SITE_OTHER): Payer: Medicare Other | Admitting: Family Medicine

## 2016-10-07 VITALS — BP 110/78 | HR 66 | Temp 98.5°F | Ht 60.75 in | Wt 132.9 lb

## 2016-10-07 DIAGNOSIS — R5383 Other fatigue: Secondary | ICD-10-CM

## 2016-10-07 DIAGNOSIS — M19041 Primary osteoarthritis, right hand: Secondary | ICD-10-CM | POA: Diagnosis not present

## 2016-10-07 DIAGNOSIS — R739 Hyperglycemia, unspecified: Secondary | ICD-10-CM | POA: Diagnosis not present

## 2016-10-07 DIAGNOSIS — Z780 Asymptomatic menopausal state: Secondary | ICD-10-CM

## 2016-10-07 DIAGNOSIS — M19042 Primary osteoarthritis, left hand: Secondary | ICD-10-CM | POA: Diagnosis not present

## 2016-10-07 DIAGNOSIS — E785 Hyperlipidemia, unspecified: Secondary | ICD-10-CM

## 2016-10-07 DIAGNOSIS — Z1159 Encounter for screening for other viral diseases: Secondary | ICD-10-CM

## 2016-10-07 DIAGNOSIS — Z0000 Encounter for general adult medical examination without abnormal findings: Secondary | ICD-10-CM

## 2016-10-07 LAB — HEPATIC FUNCTION PANEL
ALT: 29 U/L (ref 0–35)
AST: 22 U/L (ref 0–37)
Albumin: 4.2 g/dL (ref 3.5–5.2)
Alkaline Phosphatase: 56 U/L (ref 39–117)
BILIRUBIN DIRECT: 0.1 mg/dL (ref 0.0–0.3)
Total Bilirubin: 0.9 mg/dL (ref 0.2–1.2)
Total Protein: 6.8 g/dL (ref 6.0–8.3)

## 2016-10-07 LAB — BASIC METABOLIC PANEL
BUN: 13 mg/dL (ref 6–23)
CO2: 30 meq/L (ref 19–32)
CREATININE: 0.76 mg/dL (ref 0.40–1.20)
Calcium: 9.5 mg/dL (ref 8.4–10.5)
Chloride: 105 mEq/L (ref 96–112)
GFR: 80.48 mL/min (ref >60.00)
GLUCOSE: 102 mg/dL — AB (ref 70–99)
Potassium: 4.2 mEq/L (ref 3.5–5.1)
Sodium: 143 mEq/L (ref 135–145)

## 2016-10-07 LAB — LIPID PANEL
CHOL/HDL RATIO: 3
Cholesterol: 194 mg/dL (ref 0–200)
HDL: 55.9 mg/dL (ref >39.00)
LDL Cholesterol: 109 mg/dL — ABNORMAL HIGH (ref 0–99)
NonHDL: 138.31
Triglycerides: 145 mg/dL (ref 0.0–149.0)
VLDL: 29 mg/dL (ref 0.0–40.0)

## 2016-10-07 LAB — HEMOGLOBIN A1C: HEMOGLOBIN A1C: 6.1 % (ref 4.6–6.5)

## 2016-10-07 LAB — TSH: TSH: 2.57 u[IU]/mL (ref 0.35–4.50)

## 2016-10-07 NOTE — Progress Notes (Signed)
Subjective:     Patient ID: Claire [name], female   DOB: 21-Jul-1950, 66 y.o.   MRN: [id number]  HPI Patient is here for Medicare wellness visit with our health coach and also for medical follow-up. She has history of mild hyperglycemia. Her last A1c was 6.0%. No polyuria or polydipsia. She has hyperlipidemia treated with atorvastatin 20 mg daily. Denies any side effects. Compliant with therapy.  Her major issue is progressive arthritis pain especially in her hands and shoulders. She has seen orthopedist multiple times and has had steroid injections for left shoulder without much improvement. She's taken various nonsteroidals for her hand arthritis with modest improvement. Someone previously prescribed Celebrex.  Does complain of some generalized fatigue. Sleeping fairly well. Positive family history hypothyroidism. Patient denies any recent appetite or weight changes. No chest pains. No dyspnea.  Past Medical History:  Diagnosis Date  . Arthritis   . GERD (gastroesophageal reflux disease)   . Osteopenia   . Positive TB test    Past Surgical History:  Procedure Laterality Date  . BREAST LUMPECTOMY Left 1996   benign  . [place]  . SHOULDER ARTHROSCOPY Left 2012   left  . WRIST FRACTURE SURGERY Right 2004   Right    reports that she has never smoked. She has never used smokeless tobacco. She reports that she does not drink alcohol. Her drug history is not on file. family history includes Hyperlipidemia in her father; Lymphoma in her brother. Allergies  Allergen Reactions  . Penicillins      Review of Systems  Constitutional: Negative for fatigue.  Eyes: Negative for visual disturbance.  Respiratory: Negative for cough, chest tightness, shortness of breath and wheezing.   Cardiovascular: Negative for chest pain, palpitations and leg swelling.  Gastrointestinal: Negative for abdominal pain.  Endocrine: Negative for polydipsia and polyuria.  Musculoskeletal:  Positive for arthralgias.  Neurological: Negative for dizziness, seizures, syncope, weakness, light-headedness and headaches.       Objective:   Physical Exam  Constitutional: She is oriented to person, place, and time. She appears well-developed and well-nourished.  Eyes: Pupils are equal, round, and reactive to light.  Neck: Neck supple. No JVD present. No thyromegaly present.  Cardiovascular: Normal rate and regular rhythm.  Exam reveals no gallop.   Pulmonary/Chest: Effort normal and breath sounds normal. No respiratory distress. She has no wheezes. She has no rales.  Musculoskeletal: She exhibits no edema.  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 She also has fairly prominent bunions of both feet  Neurological: She is alert and oriented to person, place, and time.       Assessment:     #1 hyperlipidemia treated with atorvastatin  #2 osteoarthritis especially involving hands  #3 history of prediabetes  #4 fatigue.    Plan:     -Check further labs today with hepatitis C antibody, lipid, hepatic panel, hemoglobin W4R, basic metabolic panel, TSH -Patient will schedule repeat colonoscopy -She will continue see GYN for Pap smears and mammograms. She is currently getting yearly mammograms -Caution with regular use of nonsteroidals for osteoarthritis  Eulas Post MD [facility] at [facility]

## 2016-10-07 NOTE — Progress Notes (Addendum)
Subjective:   Claire [name] is a 66 y.o. female who presents for Medicare Annual (Subsequent) preventive examination.  Review of Systems:  No ROS.  Medicare Wellness Visit. Additional risk factors are reflected in the social history.  Cardiac Risk Factors include: advanced age (>7men, >50 women)   Sleep patterns: 5-8 hrs/night. Takes melatonin. Wakes up occ. During night. Sleeps "so so." Wakes up feeling tired sometimes but cannot sleep anymore. Home Safety/Smoke Alarms: Feels safe in home. Smoke alarms in place.  Living environment; residence and Firearm Safety: Two story home, lives with husband.  Seat Belt Safety/Bike Helmet: Wears seat belt.   Counseling:   Dental- Every 6 months.  Female:   Pap-  Every 2 years with Gynecologist.     Mammo-      12/10/2015. Pt will get Mammo and Dexa done together after 12/10/16 at the [facility]. Dexa scan-   11/08/2014 [facility]- Dr Wallis Mart. Pt will schedule.     Objective:     Vitals: BP 110/78 (BP Location: Right Arm, Patient Position: Sitting, Cuff Size: Normal)   Pulse 66   Temp 98.5 F (36.9 C) (Oral)   Ht 5' 0.75" (1.543 m)   Wt 132 lb 14.4 oz (60.3 kg)   SpO2 98%   BMI 25.32 kg/m   Body mass index is 25.32 kg/m.   Tobacco History  Smoking Status  . Never Smoker  Smokeless Tobacco  . Never Used     Counseling given: Not Answered   Past Medical History:  Diagnosis Date  . Arthritis   . GERD (gastroesophageal reflux disease)   . Hyperlipidemia   . Osteopenia   . Positive TB test    Past Surgical History:  Procedure Laterality Date  . BREAST LUMPECTOMY Left 1996   benign  . [place]  . SHOULDER ARTHROSCOPY Left 2012   left  . WRIST FRACTURE SURGERY Right 2004   Right   Family History  Problem Relation Age of Onset  . Hyperlipidemia Father   . Lymphoma Brother    History  Sexual Activity  . Sexual activity: Not on file    Outpatient Encounter Prescriptions as of 10/07/2016    Medication Sig  . Ascorbic Acid (VITAMIN C PO) Take by mouth.  Marland Kitchen aspirin 81 MG tablet Take 81 mg by mouth daily.  Marland Kitchen atorvastatin (LIPITOR) 20 MG tablet Take 20 mg by mouth daily.  . B Complex Vitamins (VITAMIN-B COMPLEX PO) Take by mouth.  . Calcium Carbonate (CALCIUM 600 PO) Take by mouth.  . calcium carbonate (TUMS - DOSED IN MG ELEMENTAL CALCIUM) 500 MG chewable tablet Chew 1 tablet by mouth daily. As needed  . celecoxib (CELEBREX) 100 MG capsule Take 100 mg by mouth daily as needed.  . famotidine (PEPCID) 20 MG tablet Take 20 mg by mouth daily. As needed  . meloxicam (MOBIC) 15 MG tablet Take 15 mg by mouth as needed.   . Misc Natural Products (GLUCOSAMINE CHONDROITIN MSM) TABS Take by mouth.  . Multiple Vitamin (MULTIVITAMIN) tablet Take 1 tablet by mouth daily.  . pantoprazole (PROTONIX) 40 MG tablet TAKE 1 TABLET(40 MG) BY MOUTH DAILY  . RESTASIS 0.05 % ophthalmic emulsion INT 1 GTT IN OU BID   No facility-administered encounter medications on file as of 10/07/2016.     Activities of Daily Living In your present state of health, do you have any difficulty performing the following activities: 10/07/2016  Hearing? N  Vision? N  Difficulty concentrating or  making decisions? N  Walking or climbing stairs? N  Dressing or bathing? N  Doing errands, shopping? N  Preparing Food and eating ? N  Using the Toilet? N  In the past six months, have you accidently leaked urine? N  Do you have problems with loss of bowel control? N  Managing your Medications? N  Managing your Finances? N  Housekeeping or managing your Housekeeping? N  Some recent data might be hidden    Patient Care Team: Eulas Post, MD as PCP - General (Family Medicine)    Assessment:    Physical assessment deferred to PCP.  Exercise Activities and Dietary recommendations Current Exercise Habits: Home exercise routine, Type of exercise: treadmill;yoga;walking, Time (Minutes): 30 (30-45 mins.), Frequency  (Times/Week): 6, Weekly Exercise (Minutes/Week): 180, Intensity: Moderate  Diet (meal preparation, eat out, water intake, caffeinated beverages, dairy products, fruits and vegetables): 2-3 meals/day. Snacks on fruits. 1,000-1,500 ml water/day. Most meals cooked at home.  Breakfast: 1 piece wheat bread with jam and egg with fruit. 1 cup coffee and 1 cup soy milk.  Lunch: Fish, rice, fruit, vegetable. Dinner: Protein, vegetable and rice.      Goals    None     Fall Risk Fall Risk  10/07/2016 10/05/2015 10/05/2015 08/02/2014  Falls in the past year? No Yes No No  Number falls in past yr: - 1 - -  Injury with Fall? - No - -   Depression Screen PHQ 2/9 Scores 10/07/2016 10/05/2015 10/05/2015 08/02/2014  PHQ - 2 Score 0 0 0 0     Cognitive Function   Ad8 score reviewed for issues:  Issues making decisions:no  Less interest in hobbies / activities:no  Repeats questions, stories (family complaining):no  Trouble using ordinary gadgets (microwave, computer, phone):no  Forgets the month or year: no  Mismanaging finances: no  Remembering appts:no  Daily problems with thinking and/or memory:no Ad8 score is=0      Immunization History  Administered Date(s) Administered  . DT 09/02/2002, 09/02/2011  . Hepatitis A 02/16/2013, 07/27/2013  . Influenza, High Dose Seasonal PF 12/07/2015  . Influenza-Unspecified 02/05/2012, 01/04/2013, 01/30/2015  . Pneumococcal Conjugate-13 10/03/2014  . Pneumococcal Polysaccharide-23 10/22/2015  . Zoster 09/01/2008   Screening Tests Health Maintenance  Topic Date Due  . Hepatitis C Screening  10/02/1950  . COLONOSCOPY  12/18/1998  . INFLUENZA VACCINE  10/29/2016  . MAMMOGRAM  12/09/2017  . TETANUS/TDAP  09/01/2021  . DEXA SCAN  Completed  . PNA vac Low Risk Adult  Completed      Plan:   Follow up with PCP as directed.   Schedule Bone Density and Mammogram after 12/10/16.  Schedule colonoscopy.  I have personally reviewed and noted the following  in the patient's chart:   . Medical and social history . Use of alcohol, tobacco or illicit drugs  . Current medications and supplements . Functional ability and status . Nutritional status . Physical activity . Advanced directives . List of other physicians . Vitals . Screenings to include cognitive, depression, and falls . Referrals and appointments  In addition, I have reviewed and discussed with patient certain preventive protocols, quality metrics, and best practice recommendations. A written personalized care plan for preventive services as well as general preventive health recommendations were provided to patient.     Ree Edman, RN  10/07/2016  Agree with assessment as above.  Eulas Post MD [facility] at [facility]

## 2016-10-07 NOTE — Patient Instructions (Addendum)
Call Dr Buccini's Office to set up repeat colonoscopy. Continue with yearly flu vaccine in the Fall. We will call you with labs done today. Schedule you Bone Denisty and Mammogram through the [facility] 12/10/16 per insurance requirements. They will call you to set this up.   Preventive Care 71 Years and Older, Female Preventive care refers to lifestyle choices and visits with your health care provider that can promote health and wellness. What does preventive care include?  A yearly physical exam. This is also called an annual well check.  Dental exams once or twice a year.  Routine eye exams. Ask your health care provider how often you should have your eyes checked.  Personal lifestyle choices, including: ? Daily care of your teeth and gums. ? Regular physical activity. ? Eating a healthy diet. ? Avoiding tobacco and drug use. ? Limiting alcohol use. ? Practicing safe sex. ? Taking low-dose aspirin every day. ? Taking vitamin and mineral supplements as recommended by your health care provider. What happens during an annual well check? The services and screenings done by your health care provider during your annual well check will depend on your age, overall health, lifestyle risk factors, and family history of disease. Counseling Your health care provider may ask you questions about your:  Alcohol use.  Tobacco use.  Drug use.  Emotional well-being.  Home and relationship well-being.  Sexual activity.  Eating habits.  History of falls.  Memory and ability to understand (cognition).  Work and work Statistician.  Reproductive health.  Screening You may have the following tests or measurements:  Height, weight, and BMI.  Blood pressure.  Lipid and cholesterol levels. These may be checked every 5 years, or more frequently if you are over 71 years old.  Skin check.  Lung cancer screening. You may have this screening every year starting at age 33 if  you have a 30-pack-year history of smoking and currently smoke or have quit within the past 15 years.  Fecal occult blood test (FOBT) of the stool. You may have this test every year starting at age 57.  Flexible sigmoidoscopy or colonoscopy. You may have a sigmoidoscopy every 5 years or a colonoscopy every 10 years starting at age 51.  Hepatitis C blood test.  Hepatitis B blood test.  Sexually transmitted disease (STD) testing.  Diabetes screening. This is done by checking your blood sugar (glucose) after you have not eaten for a while (fasting). You may have this done every 1-3 years.  Bone density scan. This is done to screen for osteoporosis. You may have this done starting at age 50.  Mammogram. This may be done every 1-2 years. Talk to your health care provider about how often you should have regular mammograms.  Talk with your health care provider about your test results, treatment options, and if necessary, the need for more tests. Vaccines Your health care provider may recommend certain vaccines, such as:  Influenza vaccine. This is recommended every year.  Tetanus, diphtheria, and acellular pertussis (Tdap, Td) vaccine. You may need a Td booster every 10 years.  Varicella vaccine. You may need this if you have not been vaccinated.  Zoster vaccine. You may need this after age 82.  Measles, mumps, and rubella (MMR) vaccine. You may need at least one dose of MMR if you were born in 1957 or later. You may also need a second dose.  Pneumococcal 13-valent conjugate (PCV13) vaccine. One dose is recommended after age 45.  Pneumococcal  polysaccharide (PPSV23) vaccine. One dose is recommended after age 70.  Meningococcal vaccine. You may need this if you have certain conditions.  Hepatitis A vaccine. You may need this if you have certain conditions or if you travel or work in places where you may be exposed to hepatitis A.  Hepatitis B vaccine. You may need this if you have  certain conditions or if you travel or work in places where you may be exposed to hepatitis B.  Haemophilus influenzae type b (Hib) vaccine. You may need this if you have certain conditions.  Talk to your health care provider about which screenings and vaccines you need and how often you need them. This information is not intended to replace advice given to you by your health care provider. Make sure you discuss any questions you have with your health care provider. Document Released: 04/13/2015 Document Revised: 12/05/2015 Document Reviewed: 01/16/2015 Elsevier Interactive Patient Education  2017 [employer].

## 2016-10-08 LAB — HEPATITIS C ANTIBODY: HCV Ab: NEGATIVE

## 2016-11-05 ENCOUNTER — Other Ambulatory Visit: Payer: Self-pay

## 2016-11-05 ENCOUNTER — Other Ambulatory Visit: Payer: Self-pay | Admitting: Family Medicine

## 2016-11-05 DIAGNOSIS — E2839 Other primary ovarian failure: Secondary | ICD-10-CM

## 2016-11-05 DIAGNOSIS — Z1231 Encounter for screening mammogram for malignant neoplasm of breast: Secondary | ICD-10-CM

## 2016-11-28 ENCOUNTER — Ambulatory Visit (INDEPENDENT_AMBULATORY_CARE_PROVIDER_SITE_OTHER): Payer: Medicare Other | Admitting: Family Medicine

## 2016-11-28 ENCOUNTER — Encounter: Payer: Self-pay | Admitting: Family Medicine

## 2016-11-28 VITALS — BP 116/74 | HR 82 | Temp 98.7°F | Ht 60.75 in | Wt 134.1 lb

## 2016-11-28 DIAGNOSIS — M25511 Pain in right shoulder: Secondary | ICD-10-CM

## 2016-11-28 MED ORDER — METHOCARBAMOL 500 MG PO TABS: 500.0000 mg | ORAL_TABLET | Freq: Three times a day (TID) | ORAL | 0 refills | Status: DC | PRN

## 2016-11-28 MED ORDER — PREDNISONE 20 MG PO TABS: ORAL_TABLET | ORAL | 0 refills | Status: DC

## 2016-11-28 NOTE — Progress Notes (Signed)
Subjective:     Patient ID: Claire [name], female   DOB: 1951-03-07, 66 y.o.   MRN: [id number]  HPI Patient seen with right shoulder pain for approximately 2 months. She states during the past year she had some left shoulder pain which was very similar.   Went to orthopedist and had a couple injections without improvement. She eventually was placed on oral prednisone and her shoulder pain resolved. She denies any injury. She is right-hand dominant. Pain is worse with abduction and internal rotation. No visible swelling. No neck pain. No definite weakness. Mild nighttime pain.  Past Medical History:  Diagnosis Date  . Arthritis   . GERD (gastroesophageal reflux disease)   . Hyperlipidemia   . Osteopenia   . Positive TB test    Past Surgical History:  Procedure Laterality Date  . BREAST LUMPECTOMY Left 1996   benign  . [place]  . SHOULDER ARTHROSCOPY Left 2012   left  . WRIST FRACTURE SURGERY Right 2004   Right    reports that she has never smoked. She has never used smokeless tobacco. She reports that she does not drink alcohol. Her drug history is not on file. family history includes Hyperlipidemia in her father; Lymphoma in her brother. Allergies  Allergen Reactions  . Penicillins      Review of Systems  Constitutional: Negative for chills and fever.  Cardiovascular: Negative for chest pain.  Musculoskeletal: Negative for neck pain.       Objective:   Physical Exam  Constitutional: She appears well-developed and well-nourished.  Eyes: Pupils are equal, round, and reactive to light.  Neck: Neck supple. No JVD present. No thyromegaly present.  Cardiovascular: Normal rate and regular rhythm.  Exam reveals no gallop.   Pulmonary/Chest: Effort normal and breath sounds normal. No respiratory distress. She has no wheezes. She has no rales.  Musculoskeletal: She exhibits no edema.  Shoulders are symmetric in appearance. No localized tenderness. She has fairly  good range of motion but does have some definite pain with internal rotation and abduction greater than 80 against resistance  Neurological: She is alert.       Assessment:     Probable right rotator cuff syndrome/tendinitis    Plan:     -We discussed possible steroid injection and patient is requesting oral prednisone which seemed to work for her previously. We agreed to cautious taper prednisone but need to monitor blood sugars closely -Gentle range of motion to avoid adhesive capsulitis -Recommend follow-up in 2 weeks if not seeing significant improvement  Eulas Post MD [facility] at [facility]

## 2016-11-28 NOTE — Patient Instructions (Signed)
Shoulder Impingement Syndrome Shoulder impingement syndrome is a condition that causes pain when connective tissues (tendons) surrounding the shoulder joint become pinched. These tendons are part of the group of muscles and tissues that help to stabilize the shoulder (rotator cuff). Beneath the rotator cuff is a fluid-filled sac (bursa) that allows the muscles and tendons to glide smoothly. The bursa may become swollen or irritated (bursitis). Bursitis, swelling in the rotator cuff tendons, or both conditions can decrease how much space is under a bone in the shoulder joint (acromion), resulting in impingement. What are the causes? Shoulder impingement syndrome can be caused by bursitis or swelling of the rotator cuff tendons, which may result from:  Repetitive overhead arm movements.  Falling onto the shoulder.  Weakness in the shoulder muscles.  What increases the risk? You may be more likely to develop this condition if you are an athlete who participates in:  Sports that involve throwing, such as baseball.  Tennis.  Swimming.  Volleyball.  Some people are also more likely to develop impingement syndrome because of the shape of their acromion bone. What are the signs or symptoms? The main symptom of this condition is pain on the front or side of the shoulder. Pain may:  Get worse when lifting or raising the arm.  Get worse at night.  Wake you up from sleeping.  Feel sharp when the shoulder is moved, and then fade to an ache.  Other signs and symptoms may include:  Tenderness.  Stiffness.  Inability to raise the arm above shoulder level or behind the body.  Weakness.  How is this diagnosed? This condition may be diagnosed based on:  Your symptoms.  Your medical history.  A physical exam.  Imaging tests, such as: ? X-rays. ? MRI. ? Ultrasound.  How is this treated? Treatment for this condition may include:  Resting your shoulder and avoiding all  activities that cause pain or put stress on the shoulder.  Icing your shoulder.  NSAIDs to help reduce pain and swelling.  One or more injections of medicines to numb the area and reduce inflammation.  Physical therapy.  Surgery. This may be needed if nonsurgical treatments have not helped. Surgery may involve repairing the rotator cuff, reshaping the acromion, or removing the bursa.  Follow these instructions at home: Managing pain, stiffness, and swelling  If directed, apply ice to the injured area. ? Put ice in a plastic bag. ? Place a towel between your skin and the bag. ? Leave the ice on for 20 minutes, 2-3 times a day. Activity  Rest and return to your normal activities as told by your health care provider. Ask your health care provider what activities are safe for you.  Do exercises as told by your health care provider. General instructions  Do not use any tobacco products, including cigarettes, chewing tobacco, or e-cigarettes. Tobacco can delay healing. If you need help quitting, ask your health care provider.  Ask your health care provider when it is safe for you to drive.  Take over-the-counter and prescription medicines only as told by your health care provider.  Keep all follow-up visits as told by your health care provider. This is important. How is this prevented?  Give your body time to rest between periods of activity.  Be safe and responsible while being active to avoid falls.  Maintain physical fitness, including strength and flexibility. Contact a health care provider if:  Your symptoms have not improved after 1-2 months of treatment and   rest.  You cannot lift your arm away from your body. This information is not intended to replace advice given to you by your health care provider. Make sure you discuss any questions you have with your health care provider. Document Released: 03/17/2005 Document Revised: 11/22/2015 Document Reviewed:  02/17/2015 Elsevier Interactive Patient Education  2018 [employer] Inc[employer]  

## 2016-12-10 ENCOUNTER — Ambulatory Visit
Admission: RE | Admit: 2016-12-10 | Discharge: 2016-12-10 | Disposition: A | Discharge status: Home / Self Care | Payer: Medicare Other | Source: Ambulatory Visit | Attending: Family Medicine | Admitting: Family Medicine

## 2016-12-10 DIAGNOSIS — Z1231 Encounter for screening mammogram for malignant neoplasm of breast: Secondary | ICD-10-CM

## 2016-12-10 DIAGNOSIS — M8589 Other specified disorders of bone density and structure, multiple sites: Secondary | ICD-10-CM | POA: Diagnosis not present

## 2016-12-10 DIAGNOSIS — Z780 Asymptomatic menopausal state: Secondary | ICD-10-CM | POA: Diagnosis not present

## 2016-12-10 DIAGNOSIS — E2839 Other primary ovarian failure: Secondary | ICD-10-CM

## 2016-12-15 ENCOUNTER — Encounter: Payer: Self-pay | Admitting: Family Medicine

## 2016-12-15 ENCOUNTER — Ambulatory Visit (INDEPENDENT_AMBULATORY_CARE_PROVIDER_SITE_OTHER): Payer: Medicare Other | Admitting: Family Medicine

## 2016-12-15 VITALS — BP 102/70 | HR 86 | Temp 98.4°F | Wt 136.5 lb

## 2016-12-15 DIAGNOSIS — J069 Acute upper respiratory infection, unspecified: Secondary | ICD-10-CM

## 2016-12-15 DIAGNOSIS — B9789 Other viral agents as the cause of diseases classified elsewhere: Secondary | ICD-10-CM

## 2016-12-15 MED ORDER — HYDROCODONE-HOMATROPINE 5-1.5 MG/5ML PO SYRP: 5.0000 mL | ORAL_SOLUTION | Freq: Four times a day (QID) | ORAL | 0 refills | Status: AC | PRN

## 2016-12-15 NOTE — Progress Notes (Signed)
Subjective:     Patient ID: Claire [name], female   DOB: 13-Sep-1950, 66 y.o.   MRN: [id number]  HPI Patient seen with respiratory illness. She and her husband just returned from [place] There were down there for a few days doing fairly physical work. They returned on Tuesday. By Wednesday she had some body aches and nasal congestion and cough. Her cough has been severe at night. Mostly nonproductive. No fever. She is using NyQuil with minimal relief of cough. Denies any nausea, vomiting, or diarrhea.  Past Medical History:  Diagnosis Date  . Arthritis   . GERD (gastroesophageal reflux disease)   . Hyperlipidemia   . Osteopenia   . Positive TB test    Past Surgical History:  Procedure Laterality Date  . BREAST BIOPSY Left    No noticable scar, Benign   . BREAST LUMPECTOMY Left 1996   benign  . [place]  . SHOULDER ARTHROSCOPY Left 2012   left  . WRIST FRACTURE SURGERY Right 2004   Right    reports that she has never smoked. She has never used smokeless tobacco. She reports that she does not drink alcohol. Her drug history is not on file. family history includes Hyperlipidemia in her father; Lymphoma in her brother. Allergies  Allergen Reactions  . Penicillins      Review of Systems  Constitutional: Positive for fatigue.  HENT: Positive for congestion and sore throat.   Respiratory: Positive for cough. Negative for shortness of breath and wheezing.   Cardiovascular: Negative for chest pain.       Objective:   Physical Exam  Constitutional: She appears well-developed and well-nourished.  HENT:  Right Ear: External ear normal.  Left Ear: External ear normal.  Mouth/Throat: Oropharynx is clear and moist.  Neck: Neck supple.  Cardiovascular: Normal rate and regular rhythm.   Pulmonary/Chest: Effort normal and breath sounds normal. No respiratory distress. She has no wheezes. She has no rales.  Lymphadenopathy:    She has no cervical  adenopathy.       Assessment:     Probable viral URI with cough    Plan:     -Hycodan cough syrup 1 teaspoon daily at bedtime for severe cough -Stay well-hydrated -Continue Tylenol or Motrin as needed for body aches  Eulas Post MD [facility] at [facility]

## 2016-12-15 NOTE — Patient Instructions (Signed)

## 2016-12-17 ENCOUNTER — Ambulatory Visit (INDEPENDENT_AMBULATORY_CARE_PROVIDER_SITE_OTHER): Payer: Medicare Other | Admitting: Family Medicine

## 2016-12-17 ENCOUNTER — Encounter: Payer: Self-pay | Admitting: Family Medicine

## 2016-12-17 ENCOUNTER — Ambulatory Visit (INDEPENDENT_AMBULATORY_CARE_PROVIDER_SITE_OTHER)
Admission: RE | Admit: 2016-12-17 | Discharge: 2016-12-17 | Disposition: A | Discharge status: Home / Self Care | Payer: Medicare Other | Source: Ambulatory Visit | Attending: Family Medicine | Admitting: Family Medicine

## 2016-12-17 VITALS — BP 120/80 | HR 84 | Temp 98.3°F | Wt 134.0 lb

## 2016-12-17 DIAGNOSIS — R05 Cough: Secondary | ICD-10-CM

## 2016-12-17 DIAGNOSIS — R509 Fever, unspecified: Secondary | ICD-10-CM | POA: Diagnosis not present

## 2016-12-17 DIAGNOSIS — R059 Cough, unspecified: Secondary | ICD-10-CM

## 2016-12-17 MED ORDER — DOXYCYCLINE HYCLATE 100 MG PO CAPS: 100.0000 mg | ORAL_CAPSULE | Freq: Two times a day (BID) | ORAL | 0 refills | Status: DC

## 2016-12-17 NOTE — Progress Notes (Signed)
Subjective:     Patient ID: Claire [name], female   DOB: Jan 24, 1951, 66 y.o.   MRN: [id number]  HPI Patient seen with recent upper respiratory illness. She was seen just 2 days ago and felt to have virus. Since then she has developed intermittent fever up to 101.5. Cough is mostly nonproductive but occasionally productive of yellow sputum. Denies any nausea or vomiting. No dyspnea. No confusion. No recent hospitalizations. She did have recent travel to [place] Husband has similar symptoms but no fever  Past Medical History:  Diagnosis Date  . Arthritis   . GERD (gastroesophageal reflux disease)   . Hyperlipidemia   . Osteopenia   . Positive TB test    Past Surgical History:  Procedure Laterality Date  . BREAST BIOPSY Left    No noticable scar, Benign   . BREAST LUMPECTOMY Left 1996   benign  . [place]  . SHOULDER ARTHROSCOPY Left 2012   left  . WRIST FRACTURE SURGERY Right 2004   Right    reports that she has never smoked. She has never used smokeless tobacco. She reports that she does not drink alcohol. Her drug history is not on file. family history includes Hyperlipidemia in her father; Lymphoma in her brother. Allergies  Allergen Reactions  . Penicillins      Review of Systems  Constitutional: Positive for chills, fatigue and fever.  HENT: Positive for congestion.   Respiratory: Positive for cough. Negative for shortness of breath and wheezing.        Objective:   Physical Exam  Constitutional: She appears well-developed and well-nourished.  HENT:  Right Ear: External ear normal.  Left Ear: External ear normal.  Mouth/Throat: Oropharynx is clear and moist.  Neck: Neck supple.  Cardiovascular: Normal rate and regular rhythm.   Pulmonary/Chest: Effort normal. She has no wheezes. She has no rales.  Patient has slightly diminished breath sounds right base compared to left. No rales       Assessment:     Upper respiratory illness  with cough and now fever. Concern is whether she may have early community-acquired right lower lobe pneumonia. She does not have any respiratory distress and pulse oximetry 96%.  She states she has had prior intolerance with Levaquin but no true allergy. Is allergic to penicillin    Plan:     -Start doxycycline 100 mg twice daily with food -Obtain PA and lateral chest x-ray to further evaluate -Follow-up immediately for any increased shortness of breath or other concerns  Eulas Post MD [facility] at [facility]

## 2016-12-17 NOTE — Patient Instructions (Signed)
Community-Acquired Pneumonia, Adult °Pneumonia is an infection of the lungs. There are different types of pneumonia. One type can develop while a person is in a hospital. A different type, called community-acquired pneumonia, develops in people who are not, or have not recently been, in the hospital or other health care facility. °What are the causes? °Pneumonia may be caused by bacteria, viruses, or funguses. Community-acquired pneumonia is often caused by Streptococcus pneumonia bacteria. These bacteria are often passed from one person to another by breathing in droplets from the cough or sneeze of an infected person. °What increases the risk? °The condition is more likely to develop in: °· People who have chronic diseases, such as chronic obstructive pulmonary disease (COPD), asthma, congestive heart failure, cystic fibrosis, diabetes, or kidney disease. °· People who have early-stage or late-stage HIV. °· People who have sickle cell disease. °· People who have had their spleen removed (splenectomy). °· People who have poor dental hygiene. °· People who have medical conditions that increase the risk of breathing in (aspirating) secretions their own mouth and nose. °· People who have a weakened immune system (immunocompromised). °· People who smoke. °· People who travel to areas where pneumonia-causing germs commonly exist. °· People who are around animal habitats or animals that have pneumonia-causing germs, including birds, bats, rabbits, cats, and farm animals. ° °What are the signs or symptoms? °Symptoms of this condition include: °· A dry cough. °· A wet (productive) cough. °· Fever. °· Sweating. °· Chest pain, especially when breathing deeply or coughing. °· Rapid breathing or difficulty breathing. °· Shortness of breath. °· Shaking chills. °· Fatigue. °· Muscle aches. ° °How is this diagnosed? °Your health care provider will take a medical history and perform a physical exam. You may also have other tests,  including: °· Imaging studies of your chest, including X-rays. °· Tests to check your blood oxygen level and other blood gases. °· Other tests on blood, mucus (sputum), fluid around your lungs (pleural fluid), and urine. ° °If your pneumonia is severe, other tests may be done to identify the specific cause of your illness. °How is this treated? °The type of treatment that you receive depends on many factors, such as the cause of your pneumonia, the medicines you take, and other medical conditions that you have. For most adults, treatment and recovery from pneumonia may occur at home. In some cases, treatment must happen in a hospital. Treatment may include: °· Antibiotic medicines, if the pneumonia was caused by bacteria. °· Antiviral medicines, if the pneumonia was caused by a virus. °· Medicines that are given by mouth or through an IV tube. °· Oxygen. °· Respiratory therapy. ° °Although rare, treating severe pneumonia may include: °· Mechanical ventilation. This is done if you are not breathing well on your own and you cannot maintain a safe blood oxygen level. °· Thoracentesis. This procedure removes fluid around one lung or both lungs to help you breathe better. ° °Follow these instructions at home: °· Take over-the-counter and prescription medicines only as told by your health care provider. °? Only take cough medicine if you are losing sleep. Understand that cough medicine can prevent your body’s natural ability to remove mucus from your lungs. °? If you were prescribed an antibiotic medicine, take it as told by your health care provider. Do not stop taking the antibiotic even if you start to feel better. °· Sleep in a semi-upright position at night. Try sleeping in a reclining chair, or place a few pillows under your head. °· Do not use tobacco products, including cigarettes, chewing   tobacco, and e-cigarettes. If you need help quitting, ask your health care provider. °· Drink enough water to keep your urine  clear or pale yellow. This will help to thin out mucus secretions in your lungs. °How is this prevented? °There are ways that you can decrease your risk of developing community-acquired pneumonia. Consider getting a pneumococcal vaccine if: °· You are older than 68 years of age. °· You are older than 68 years of age and are undergoing cancer treatment, have chronic lung disease, or have other medical conditions that affect your immune system. Ask your health care provider if this applies to you. ° °There are different types and schedules of pneumococcal vaccines. Ask your health care provider which vaccination option is best for you. °You may also prevent community-acquired pneumonia if you take these actions: °· Get an influenza vaccine every year. Ask your health care provider which type of influenza vaccine is best for you. °· Go to the dentist on a regular basis. °· Wash your hands often. Use hand sanitizer if soap and water are not available. ° °Contact a health care provider if: °· You have a fever. °· You are losing sleep because you cannot control your cough with cough medicine. °Get help right away if: °· You have worsening shortness of breath. °· You have increased chest pain. °· Your sickness becomes worse, especially if you are an older adult or have a weakened immune system. °· You cough up blood. °This information is not intended to replace advice given to you by your health care provider. Make sure you discuss any questions you have with your health care provider. °Document Released: 03/17/2005 Document Revised: 07/26/2015 Document Reviewed: 07/12/2014 °Elsevier Interactive Patient Education © 2017 Elsevier Inc. ° °

## 2016-12-18 ENCOUNTER — Encounter: Payer: Self-pay | Admitting: Family Medicine

## 2016-12-19 DIAGNOSIS — M25511 Pain in right shoulder: Secondary | ICD-10-CM | POA: Diagnosis not present

## 2016-12-19 DIAGNOSIS — G8929 Other chronic pain: Secondary | ICD-10-CM | POA: Diagnosis not present

## 2016-12-19 DIAGNOSIS — M7501 Adhesive capsulitis of right shoulder: Secondary | ICD-10-CM | POA: Diagnosis not present

## 2016-12-22 DIAGNOSIS — Z124 Encounter for screening for malignant neoplasm of cervix: Secondary | ICD-10-CM | POA: Diagnosis not present

## 2016-12-22 DIAGNOSIS — N952 Postmenopausal atrophic vaginitis: Secondary | ICD-10-CM | POA: Diagnosis not present

## 2016-12-22 DIAGNOSIS — Z01419 Encounter for gynecological examination (general) (routine) without abnormal findings: Secondary | ICD-10-CM | POA: Diagnosis not present

## 2016-12-23 DIAGNOSIS — H2513 Age-related nuclear cataract, bilateral: Secondary | ICD-10-CM | POA: Diagnosis not present

## 2016-12-23 DIAGNOSIS — H25013 Cortical age-related cataract, bilateral: Secondary | ICD-10-CM | POA: Diagnosis not present

## 2016-12-23 DIAGNOSIS — H16223 Keratoconjunctivitis sicca, not specified as Sjogren's, bilateral: Secondary | ICD-10-CM | POA: Diagnosis not present

## 2016-12-23 DIAGNOSIS — H25043 Posterior subcapsular polar age-related cataract, bilateral: Secondary | ICD-10-CM | POA: Diagnosis not present

## 2016-12-31 DIAGNOSIS — M7501 Adhesive capsulitis of right shoulder: Secondary | ICD-10-CM | POA: Diagnosis not present

## 2017-01-05 DIAGNOSIS — H2512 Age-related nuclear cataract, left eye: Secondary | ICD-10-CM | POA: Diagnosis not present

## 2017-01-06 DIAGNOSIS — H2511 Age-related nuclear cataract, right eye: Secondary | ICD-10-CM | POA: Diagnosis not present

## 2017-01-14 DIAGNOSIS — M7501 Adhesive capsulitis of right shoulder: Secondary | ICD-10-CM | POA: Diagnosis not present

## 2017-01-19 DIAGNOSIS — M7501 Adhesive capsulitis of right shoulder: Secondary | ICD-10-CM | POA: Diagnosis not present

## 2017-01-19 DIAGNOSIS — G8929 Other chronic pain: Secondary | ICD-10-CM | POA: Diagnosis not present

## 2017-01-19 DIAGNOSIS — M25511 Pain in right shoulder: Secondary | ICD-10-CM | POA: Diagnosis not present

## 2017-01-21 ENCOUNTER — Ambulatory Visit (INDEPENDENT_AMBULATORY_CARE_PROVIDER_SITE_OTHER): Payer: Medicare Other | Admitting: Family Medicine

## 2017-01-21 DIAGNOSIS — Z23 Encounter for immunization: Secondary | ICD-10-CM

## 2017-02-09 DIAGNOSIS — H2511 Age-related nuclear cataract, right eye: Secondary | ICD-10-CM | POA: Diagnosis not present

## 2017-03-06 ENCOUNTER — Ambulatory Visit: Payer: Medicare Other | Admitting: Family Medicine

## 2017-03-25 ENCOUNTER — Ambulatory Visit (INDEPENDENT_AMBULATORY_CARE_PROVIDER_SITE_OTHER): Payer: Medicare Other | Admitting: Family Medicine

## 2017-03-25 ENCOUNTER — Encounter: Payer: Self-pay | Admitting: Family Medicine

## 2017-03-25 VITALS — BP 110/70 | HR 77 | Temp 98.2°F | Ht 60.75 in | Wt 131.2 lb

## 2017-03-25 DIAGNOSIS — J019 Acute sinusitis, unspecified: Secondary | ICD-10-CM | POA: Diagnosis not present

## 2017-03-25 MED ORDER — AZITHROMYCIN 250 MG PO TABS: ORAL_TABLET | ORAL | 0 refills | Status: AC

## 2017-03-25 NOTE — Patient Instructions (Signed)
Consider OTC Delsym cough syrup.

## 2017-03-25 NOTE — Progress Notes (Signed)
Subjective:     Patient ID: Claire [name], female   DOB: 27-Jul-1950, 66 y.o.   MRN: [id number]  HPI Patient seen with upper respiratory infection. She and her husband just returned from cruise recently. He has similar symptoms. She's had now about 2 weeks of progressive facial pain and thick greenish nasal discharge. Occasional cough. Cough is worse at night. She had some leftover hydrocodone cough syrup which did not seem to help. No fever. No dyspnea.  Past Medical History:  Diagnosis Date  . Arthritis   . GERD (gastroesophageal reflux disease)   . Hyperlipidemia   . Osteopenia   . Positive TB test    Past Surgical History:  Procedure Laterality Date  . BREAST BIOPSY Left    No noticable scar, Benign   . BREAST LUMPECTOMY Left 1996   benign  . [place]  . SHOULDER ARTHROSCOPY Left 2012   left  . WRIST FRACTURE SURGERY Right 2004   Right    reports that  has never smoked. she has never used smokeless tobacco. She reports that she does not drink alcohol. Her drug history is not on file. family history includes Hyperlipidemia in her father; Lymphoma in her brother. Allergies  Allergen Reactions  . Penicillins      Review of Systems  Constitutional: Positive for fatigue. Negative for chills and fever.  HENT: Positive for congestion, sinus pressure and sinus pain.   Respiratory: Positive for cough. Negative for shortness of breath.        Objective:   Physical Exam  Constitutional: She appears well-developed and well-nourished.  HENT:  Right Ear: External ear normal.  Left Ear: External ear normal.  Mouth/Throat: Oropharynx is clear and moist.  Neck: Neck supple.  Cardiovascular: Normal rate and regular rhythm.  Pulmonary/Chest: Effort normal and breath sounds normal. No respiratory distress. She has no wheezes. She has no rales.  Lymphadenopathy:    She has no cervical adenopathy.       Assessment:     Patient presents with upper respiratory  infection.  Progressive over 2 weeks duration. Suspect acute pansinusitis    Plan:     -Patient requesting Zithromax. She has tolerated well in the past. We explained there is increasing resistance to Zithromax but will try since she's not be treated recently.. I not responding may need to broaden her coverage. She is penicillin allergic  Eulas Post MD [facility] at [facility]

## 2017-04-07 ENCOUNTER — Encounter: Payer: Self-pay | Admitting: Family Medicine

## 2017-04-07 ENCOUNTER — Ambulatory Visit (INDEPENDENT_AMBULATORY_CARE_PROVIDER_SITE_OTHER): Payer: Medicare Other | Admitting: Family Medicine

## 2017-04-07 VITALS — BP 108/70 | HR 71 | Temp 98.4°F | Wt 128.8 lb

## 2017-04-07 DIAGNOSIS — R739 Hyperglycemia, unspecified: Secondary | ICD-10-CM | POA: Diagnosis not present

## 2017-04-07 LAB — POCT GLYCOSYLATED HEMOGLOBIN (HGB A1C): Hemoglobin A1C: 5.9

## 2017-04-07 NOTE — Progress Notes (Signed)
Subjective:     Patient ID: Claire [name], female   DOB: [birth year], 67 y.o.   MRN: [id number]  HPI Patient seen for follow-up regarding prediabetes. Last A1c 6.1%. Her weight and appetite are stable. She exercises a few days per week. Eats fairly low glycemic diet. Usually only eats rice about twice per week. No polyuria or polydipsia. No family history of diabetes.  Past Medical History:  Diagnosis Date  . Arthritis   . GERD (gastroesophageal reflux disease)   . Hyperlipidemia   . Osteopenia   . Positive TB test    Past Surgical History:  Procedure Laterality Date  . BREAST BIOPSY Left    No noticable scar, Benign   . BREAST LUMPECTOMY Left 1996   benign  . [place]  . SHOULDER ARTHROSCOPY Left 2012   left  . WRIST FRACTURE SURGERY Right 2004   Right    reports that  has never smoked. she has never used smokeless tobacco. She reports that she does not drink alcohol. Her drug history is not on file. family history includes Hyperlipidemia in her father; Lymphoma in her brother. Allergies  Allergen Reactions  . Penicillins      Review of Systems  Constitutional: Negative for fatigue.  Eyes: Negative for visual disturbance.  Respiratory: Negative for cough, chest tightness, shortness of breath and wheezing.   Cardiovascular: Negative for chest pain, palpitations and leg swelling.  Endocrine: Negative for polydipsia and polyuria.  Neurological: Negative for dizziness, seizures, syncope, weakness, light-headedness and headaches.       Objective:   Physical Exam  Constitutional: She appears well-developed and well-nourished.  Eyes: Pupils are equal, round, and reactive to light.  Neck: Neck supple. No JVD present. No thyromegaly present.  Cardiovascular: Normal rate and regular rhythm. Exam reveals no gallop.  Pulmonary/Chest: Effort normal and breath sounds normal. No respiratory distress. She has no wheezes. She has no rales.  Musculoskeletal: She  exhibits no edema.  Neurological: She is alert.       Assessment:     Prediabetes. A1c today 5.9% which is stable    Plan:     -We discussed prevention of diabetes through diet and exercise. -We'll plan follow-up A1c in about 6 months when she gets her physical  Eulas Post MD [facility] at [facility]

## 2017-04-07 NOTE — Patient Instructions (Signed)
Prediabetes Prediabetes is the condition of having a blood sugar (blood glucose) level that is higher than it should be, but not high enough for you to be diagnosed with type 2 diabetes. Having prediabetes puts you at risk for developing type 2 diabetes (type 2 diabetes mellitus). Prediabetes may be called impaired glucose tolerance or impaired fasting glucose. Prediabetes usually does not cause symptoms. Your health care provider can diagnose this condition with blood tests. You may be tested for prediabetes if you are overweight and if you have at least one other risk factor for prediabetes. Risk factors for prediabetes include:  Having a family member with type 2 diabetes.  Being overweight or obese.  Being older than age 57.  Being of American-Indian, African-American, Hispanic/Latino, or Asian/Pacific Islander descent.  Having an inactive (sedentary) lifestyle.  Having a history of gestational diabetes or polycystic ovarian syndrome (PCOS).  Having low levels of good cholesterol (HDL-C) or high levels of blood fats (triglycerides).  Having high blood pressure.  What is blood glucose and how is blood glucose measured?  Blood glucose refers to the amount of glucose in your bloodstream. Glucose comes from eating foods that contain sugars and starches (carbohydrates) that the body breaks down into glucose. Your blood glucose level may be measured in mg/dL (milligrams per deciliter) or mmol/L (millimoles per liter).Your blood glucose may be checked with one or more of the following blood tests:  A fasting blood glucose (FBG) test. You will not be allowed to eat (you will fast) for at least 8 hours before a blood sample is taken. ? A normal range for FBG is 70-100 mg/dl (3.9-5.6 mmol/L).  An A1c (hemoglobin A1c) blood test. This test provides information about blood glucose control over the previous 2?68month.  An oral glucose tolerance test (OGTT). This test measures your blood  glucose twice: ? After fasting. This is your baseline level. ? Two hours after you drink a beverage that contains glucose.  You may be diagnosed with prediabetes:  If your FBG is 100?125 mg/dL (5.6-6.9 mmol/L).  If your A1c level is 5.7?6.4%.  If your OGGT result is 140?199 mg/dL (7.8-11 mmol/L).  These blood tests may be repeated to confirm your diagnosis. What happens if blood glucose is too high? The pancreas produces a hormone (insulin) that helps move glucose from the bloodstream into cells. When cells in the body do not respond properly to insulin that the body makes (insulin resistance), excess glucose builds up in the blood instead of going into cells. As a result, high blood glucose (hyperglycemia) can develop, which can cause many complications. This is a symptom of prediabetes. What can happen if blood glucose stays higher than normal for a long time? Having high blood glucose for a long time is dangerous. Too much glucose in your blood can damage your nerves and blood vessels. Long-term damage can lead to complications from diabetes, which may include:  Heart disease.  Stroke.  Blindness.  Kidney disease.  Depression.  Poor circulation in the feet and legs, which could lead to surgical removal (amputation) in severe cases.  How can prediabetes be prevented from turning into type 2 diabetes?  To help prevent type 2 diabetes, take the following actions:  Be physically active. ? Do moderate-intensity physical activity for at least 30 minutes on at least 5 days of the week, or as much as told by your health care provider. This could be brisk walking, biking, or water aerobics. ? Ask your health care provider what  activities are safe for you. A mix of physical activities may be best, such as walking, swimming, cycling, and strength training.  Lose weight as told by your health care provider. ? Losing 5-7% of your body weight can reverse insulin resistance. ? Your health  care provider can determine how much weight loss is best for you and can help you lose weight safely.  Follow a healthy meal plan. This includes eating lean proteins, complex carbohydrates, fresh fruits and vegetables, low-fat dairy products, and healthy fats. ? Follow instructions from your health care provider about eating or drinking restrictions. ? Make an appointment to see a diet and nutrition specialist (registered dietitian) to help you create a healthy eating plan that is right for you.  Do not smoke or use any tobacco products, such as cigarettes, chewing tobacco, and e-cigarettes. If you need help quitting, ask your health care provider.  Take over-the-counter and prescription medicines as told by your health care provider. You may be prescribed medicines that help lower the risk of type 2 diabetes.  This information is not intended to replace advice given to you by your health care provider. Make sure you discuss any questions you have with your health care provider. Document Released: 07/09/2015 Document Revised: 08/23/2015 Document Reviewed: 05/08/2015 Elsevier Interactive Patient Education  2018 Elsevier Inc.  

## 2017-05-18 ENCOUNTER — Other Ambulatory Visit: Payer: Self-pay | Admitting: Family Medicine

## 2017-05-26 ENCOUNTER — Other Ambulatory Visit: Payer: Self-pay | Admitting: Family Medicine

## 2017-05-26 NOTE — Telephone Encounter (Signed)
Copied from [place] [id number]. Topic: Quick Communication - Rx Refill/Question >> May 26, 2017  1:14 PM Aurelio Brash B wrote: Medication: atorvastatin (LIPITOR) 20 MG tablet   Has the patient contacted their pharmacy? Yes    (Agent: If no, request that the patient contact the pharmacy for the refill.)   Preferred Pharmacy (with phone number or street name): [facility] [place], [phone] (Phone) [phone] (Fax)  Pt is requesting a call once this is completed.     Agent: Please be advised that RX refills may take up to 3 business days. We ask that you follow-up with your pharmacy.

## 2017-05-27 NOTE — Telephone Encounter (Signed)
Last OV: 10/07/16 PCP: [place]: [facility] [place], [address] [place] [phone] (Phone) [phone] (Fax)

## 2017-05-28 MED ORDER — ATORVASTATIN CALCIUM 20 MG PO TABS: 20.0000 mg | ORAL_TABLET | Freq: Every day | ORAL | 1 refills | Status: DC

## 2017-05-28 NOTE — Telephone Encounter (Signed)
Rx done. 

## 2017-05-28 NOTE — Telephone Encounter (Signed)
Refill for 6 months. 

## 2017-06-22 DIAGNOSIS — H02839 Dermatochalasis of unspecified eye, unspecified eyelid: Secondary | ICD-10-CM | POA: Diagnosis not present

## 2017-06-22 DIAGNOSIS — Z961 Presence of intraocular lens: Secondary | ICD-10-CM | POA: Diagnosis not present

## 2017-06-22 DIAGNOSIS — R7303 Prediabetes: Secondary | ICD-10-CM | POA: Diagnosis not present

## 2017-06-22 DIAGNOSIS — H16223 Keratoconjunctivitis sicca, not specified as Sjogren's, bilateral: Secondary | ICD-10-CM | POA: Diagnosis not present

## 2017-10-09 ENCOUNTER — Encounter: Payer: Medicare Other | Admitting: Family Medicine

## 2017-10-13 DIAGNOSIS — Z961 Presence of intraocular lens: Secondary | ICD-10-CM | POA: Diagnosis not present

## 2017-10-13 DIAGNOSIS — R7309 Other abnormal glucose: Secondary | ICD-10-CM | POA: Diagnosis not present

## 2017-10-13 DIAGNOSIS — H02831 Dermatochalasis of right upper eyelid: Secondary | ICD-10-CM | POA: Diagnosis not present

## 2017-10-13 DIAGNOSIS — H16223 Keratoconjunctivitis sicca, not specified as Sjogren's, bilateral: Secondary | ICD-10-CM | POA: Diagnosis not present

## 2017-10-14 ENCOUNTER — Ambulatory Visit (INDEPENDENT_AMBULATORY_CARE_PROVIDER_SITE_OTHER): Payer: Medicare Other | Admitting: Family Medicine

## 2017-10-14 VITALS — BP 118/58 | HR 71 | Temp 98.1°F | Ht 61.0 in | Wt 128.2 lb

## 2017-10-14 DIAGNOSIS — E785 Hyperlipidemia, unspecified: Secondary | ICD-10-CM | POA: Diagnosis not present

## 2017-10-14 DIAGNOSIS — R739 Hyperglycemia, unspecified: Secondary | ICD-10-CM

## 2017-10-14 DIAGNOSIS — R5383 Other fatigue: Secondary | ICD-10-CM

## 2017-10-14 DIAGNOSIS — M21611 Bunion of right foot: Secondary | ICD-10-CM | POA: Diagnosis not present

## 2017-10-14 DIAGNOSIS — K219 Gastro-esophageal reflux disease without esophagitis: Secondary | ICD-10-CM

## 2017-10-14 DIAGNOSIS — M8580 Other specified disorders of bone density and structure, unspecified site: Secondary | ICD-10-CM | POA: Diagnosis not present

## 2017-10-14 DIAGNOSIS — M21612 Bunion of left foot: Secondary | ICD-10-CM | POA: Diagnosis not present

## 2017-10-14 DIAGNOSIS — R7989 Other specified abnormal findings of blood chemistry: Secondary | ICD-10-CM

## 2017-10-14 LAB — BASIC METABOLIC PANEL
BUN: 14 mg/dL (ref 6–23)
CALCIUM: 9.4 mg/dL (ref 8.4–10.5)
CO2: 31 meq/L (ref 19–32)
Chloride: 103 mEq/L (ref 96–112)
Creatinine, Ser: 0.86 mg/dL (ref 0.40–1.20)
GFR: 69.57 mL/min (ref >60.00)
GLUCOSE: 104 mg/dL — AB (ref 70–99)
Potassium: 4.2 mEq/L (ref 3.5–5.1)
SODIUM: 141 meq/L (ref 135–145)

## 2017-10-14 LAB — LIPID PANEL
CHOLESTEROL: 215 mg/dL — AB (ref 0–200)
HDL: 53 mg/dL (ref >39.00)
LDL Cholesterol: 127 mg/dL — ABNORMAL HIGH (ref 0–99)
NonHDL: 162.38
TRIGLYCERIDES: 178 mg/dL — AB (ref 0.0–149.0)
Total CHOL/HDL Ratio: 4
VLDL: 35.6 mg/dL (ref 0.0–40.0)

## 2017-10-14 LAB — CBC WITH DIFFERENTIAL/PLATELET
Basophils Absolute: 0 10*3/uL (ref 0.0–0.1)
Basophils Relative: 0.9 % (ref 0.0–3.0)
Eosinophils Absolute: 0.2 10*3/uL (ref 0.0–0.7)
Eosinophils Relative: 4.6 % (ref 0.0–5.0)
HCT: 39.5 % (ref 36.0–46.0)
Hemoglobin: 13.2 g/dL (ref 12.0–15.0)
LYMPHS ABS: 1.2 10*3/uL (ref 0.7–4.0)
Lymphocytes Relative: 33.3 % (ref 12.0–46.0)
MCHC: 33.3 g/dL (ref 30.0–36.0)
MCV: 91 fl (ref 78.0–100.0)
MONO ABS: 0.3 10*3/uL (ref 0.1–1.0)
MONOS PCT: 8.2 % (ref 3.0–12.0)
NEUTROS ABS: 1.9 10*3/uL (ref 1.4–7.7)
NEUTROS PCT: 53 % (ref 43.0–77.0)
PLATELETS: 247 10*3/uL (ref 150.0–400.0)
RBC: 4.34 Mil/uL (ref 3.87–5.11)
RDW: 13.1 % (ref 11.5–15.5)
WBC: 3.6 10*3/uL — ABNORMAL LOW (ref 4.0–10.5)

## 2017-10-14 LAB — HEPATIC FUNCTION PANEL
ALBUMIN: 4.3 g/dL (ref 3.5–5.2)
ALK PHOS: 53 U/L (ref 39–117)
ALT: 17 U/L (ref 0–35)
AST: 17 U/L (ref 0–37)
Bilirubin, Direct: 0.1 mg/dL (ref 0.0–0.3)
Total Bilirubin: 1 mg/dL (ref 0.2–1.2)
Total Protein: 6.8 g/dL (ref 6.0–8.3)

## 2017-10-14 LAB — HEMOGLOBIN A1C: HEMOGLOBIN A1C: 6.2 % (ref 4.6–6.5)

## 2017-10-14 LAB — TSH: TSH: 2.65 u[IU]/mL (ref 0.35–4.50)

## 2017-10-14 NOTE — Patient Instructions (Signed)
I would like for you you to schedule a Medicare Annual Wellness Visit (AWV).   This is a yearly appointment with our Health Coach Wynetta Fines, RN) and is designed to develop a personalized prevention plan. This is not a head to toe physical, but rather an opportunity to prevent illness based on your current health and risk factors for disease.   Visits usually last 30-60 minutes and include various screenings for hearing, vision, depression, and dementia, falls, and safety concerns. The visit also includes diet and exercise counseling and information about advance directives.   This is also an opportunity to discuss appropriate health maintenance testing such as mammography, colonoscopy, lung cancer screening, and hepatitis C testing.   The AWV is fully covered by Medicare Part B if:  . You have had Part B for over 12 months, AND . You have not had an AWV in the past 12 months .  Please don't miss out on this opportunity! Set up your appointment today!  We will set up podiatry referral. Consider trial of regular Pepcid 20 mg twice daily Consider repeat DEXA by next year.

## 2017-10-14 NOTE — Progress Notes (Signed)
  Subjective:     Patient ID: Claire [name], female   DOB: 05/26/1950, 67 y.o.   MRN: [id number]  HPI Patient seen for medical follow-up. She still needs to set up Medicare wellness visit for this year. We discussed several issues as follows  Hyperlipidemia treated with Lipitor. No myalgias. She needs follow-up lab work. No history of CAD or peripheral vascular disease  History of reflux. She takes Pepcid intermittently. She's had some recent problems with increased acidity epigastric area. Occasional nausea. No major appetite or weight changes. Minimal caffeine use.  No dysphagia. No odynophagia. She's taken Protonix in the past but infrequently  Patient has history of osteoarthritis. She's also had some bunions in both feet. She has decided against surgery in the past but requests a second opinion from podiatry.  History of osteopenia. Last DEXA scan was last fall. She walks for exercise and take some calcium and vitamin D.  No hx of fracture.  Still sees gynecologist yearly for physical. She had Zostavax 2010. She has checked with pharmacy about getting new shingles vaccine  Past Medical History:  Diagnosis Date  . Arthritis   . GERD (gastroesophageal reflux disease)   . Hyperlipidemia   . Osteopenia   . Positive TB test    Past Surgical History:  Procedure Laterality Date  . BREAST BIOPSY Left    No noticable scar, Benign   . BREAST LUMPECTOMY Left 1996   benign  . [place]  . SHOULDER ARTHROSCOPY Left 2012   left  . WRIST FRACTURE SURGERY Right 2004   Right    reports that she has never smoked. She has never used smokeless tobacco. She reports that she does not drink alcohol. Her drug history is not on file. family history includes Hyperlipidemia in her father; Lymphoma in her brother. Allergies  Allergen Reactions  . Penicillins      Review of Systems  Constitutional: Negative for fatigue.  Eyes: Negative for visual disturbance.  Respiratory:  Negative for cough, chest tightness, shortness of breath and wheezing.   Cardiovascular: Negative for chest pain, palpitations and leg swelling.  Neurological: Negative for dizziness, seizures, syncope, weakness, light-headedness and headaches.       Objective:   Physical Exam  Constitutional: She appears well-developed and well-nourished.  Eyes: Pupils are equal, round, and reactive to light.  Neck: Neck supple. No JVD present. No thyromegaly present.  Cardiovascular: Normal rate and regular rhythm. Exam reveals no gallop.  Pulmonary/Chest: Effort normal and breath sounds normal. No respiratory distress. She has no wheezes. She has no rales.  Abdominal: Soft. She exhibits no mass. There is no tenderness.  Musculoskeletal: She exhibits no edema.  Patient has prominent bunions of both feet  Neurological: She is alert.       Assessment:     #1 hyperlipidemia treated with Lipitor  #2 history of GERD with occasional ongoing symptoms. Infrequent use of Pepcid or Protonix  #3 bilateral foot bunions  #4 osteopenia    Plan:     -Set up Medicare annual wellness visit -Discussed lifestyle and diet management of GERD -Start more consistent use of Pepcid 20 mg twice a day and be in touch if having breakthrough symptoms -Consider repeat DEXA scan in one year -Obtain further labs including lipid panel, basic metabolic panel, CBC, TSH, hepatic panel, hemoglobin A1c -She is checking with pharmacy regarding new shingles vaccine-  Eulas Post MD [facility] at [facility]

## 2017-10-26 ENCOUNTER — Ambulatory Visit (INDEPENDENT_AMBULATORY_CARE_PROVIDER_SITE_OTHER): Payer: Medicare Other | Admitting: Podiatry

## 2017-10-26 ENCOUNTER — Ambulatory Visit (INDEPENDENT_AMBULATORY_CARE_PROVIDER_SITE_OTHER): Payer: Medicare Other

## 2017-10-26 ENCOUNTER — Encounter: Payer: Self-pay | Admitting: Podiatry

## 2017-10-26 VITALS — BP 142/79 | HR 69 | Resp 16

## 2017-10-26 DIAGNOSIS — M2011 Hallux valgus (acquired), right foot: Secondary | ICD-10-CM

## 2017-10-26 DIAGNOSIS — M2012 Hallux valgus (acquired), left foot: Secondary | ICD-10-CM

## 2017-10-26 DIAGNOSIS — M779 Enthesopathy, unspecified: Secondary | ICD-10-CM

## 2017-10-26 NOTE — Patient Instructions (Signed)

## 2017-10-26 NOTE — Progress Notes (Signed)
   Subjective:    Patient ID: Claire [name], female    DOB: October 04, 1950, 67 y.o.   MRN: [id number]  HPI    Review of Systems  All other systems reviewed and are negative.      Objective:   Physical Exam        Assessment & Plan:

## 2017-10-27 ENCOUNTER — Ambulatory Visit: Payer: Medicare Other | Admitting: Adult Health

## 2017-10-27 NOTE — Progress Notes (Signed)
Subjective:   Patient ID: Claire [name], female   DOB: 67 y.o.   MRN: [id number]   HPI Patient presents stating she is got large bunion deformity left over right generalized foot pain and knows that she is moderately flat-footed.  Patient has family history of this problem with her mother having had it and she is been trying wider shoes and patient does have no history of smoking and likes to be active   Review of Systems  All other systems reviewed and are negative.       Objective:  Physical Exam  Constitutional: She appears well-developed and well-nourished.  Cardiovascular: Intact distal pulses.  Pulmonary/Chest: Effort normal.  Musculoskeletal: Normal range of motion.  Neurological: She is alert.  Skin: Skin is warm.  Nursing note and vitals reviewed.   Neurovascular status intact muscle strength adequate range of motion within normal limits with patient found to have large deformity first metatarsal head left over right with deviation of the big toe against the second toe left over right with depression of the arch bilateral and moderate posterior tibial type tendinitis bilateral secondary to foot structure.  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  Currently I do not recommend surgery as she does do okay with shoe modifications and I rather she go this route but I do think she would do best in a custom orthotic and I am referring her to ped orthotist for these to be made.  I recommended that he may very thin so that will fit a variety of her shoes and probably just extend through the arch.  Patient will be reevaluated for orthotic devices  X-rays indicate significant structural bunion deformity left over right with moderate depression of the arch bilateral

## 2017-10-27 NOTE — Progress Notes (Addendum)
Subjective:   Claire [name] is a 67 y.o. female who presents for Medicare Annual/Subsequent preventive examination.  Reports health as good over all  BMI 24.2  Diet Chol/hdl 4; trig 178; hdl 53;  A1c 6.2;  Decrease eating Has indigestion  Discussed pre-diabetes and she has been traveling and could not return her diet    Exercise Goes to walk - 3 to 4000 steps x 30 minutes X 1 per day and then goes to the gym; machines x 30 minutes Go to yoga    Health Maintenance Due  Topic Date Due  . COLONOSCOPY  12/18/1998   Mammogram 11/2016 -will repeat this year  Bone density 11/2016 -2.1  Cardiac Risk Factors include: advanced age (>33men, >42 women);dyslipidemia;family history of premature cardiovascular diseaseStates she had colonoscopy prior to    Objective:    Vitals: BP 106/66   Pulse 71   Temp 98.1 F (36.7 C)   Ht 5\' 1"  (1.549 m)   Wt 128 lb (58.1 kg)   SpO2 96%   BMI 24.19 kg/m   Body mass index is 24.19 kg/m.  Advanced Directives 10/28/2017 10/07/2016  Does Patient Have a Medical Advance Directive? Yes Yes  Type of Advance Directive - [place];Living will  Does patient want to make changes to medical advance directive? - No - Patient declined  Copy of [place] in Chart? - No - copy requested    Tobacco Social History   Tobacco Use  Smoking Status Never Smoker  Smokeless Tobacco Never Used     Counseling given: Yes   Clinical Intake:  Past Medical History:  Diagnosis Date  . Arthritis   . Cataracts, bilateral 2018   in November; dr. Tommy Rainwater  . GERD (gastroesophageal reflux disease)   . Hyperlipidemia   . Osteopenia   . Positive TB test    Past Surgical History:  Procedure Laterality Date  . BREAST BIOPSY Left    No noticable scar, Benign   . BREAST LUMPECTOMY Left 1996   benign  . [place]  . SHOULDER ARTHROSCOPY Left 2012   left  . WRIST FRACTURE SURGERY Right 2004   Right    Family History  Problem Relation Age of Onset  . Hyperlipidemia Father   . Lymphoma Brother   . Breast cancer Neg Hx    Social History   Socioeconomic History  . Marital status: Married    Spouse name: Not on file  . Number of children: Not on file  . Years of education: Not on file  . Highest education level: Not on file  Occupational History  . Not on file  Social Needs  . Financial resource strain: Not on file  . Food insecurity:    Worry: Not on file    Inability: Not on file  . Transportation needs:    Medical: Not on file    Non-medical: Not on file  Tobacco Use  . Smoking status: Never Smoker  . Smokeless tobacco: Never Used  Substance and Sexual Activity  . Alcohol use: No  . Drug use: Not on file  . Sexual activity: Not on file  Lifestyle  . Physical activity:    Days per week: Not on file    Minutes per session: Not on file  . Stress: Not on file  Relationships  . Social connections:    Talks on phone: Not on file    Gets together: Not on file  Attends religious service: Not on file    Active member of club or organization: Not on file    Attends meetings of clubs or organizations: Not on file    Relationship status: Not on file  Other Topics Concern  . Not on file  Social History Narrative  . Not on file    Outpatient Encounter Medications as of 10/28/2017  Medication Sig  . Ascorbic Acid (VITAMIN C PO) Take by mouth.  Marland Kitchen aspirin 81 MG tablet Take 81 mg by mouth every other day.   Marland Kitchen atorvastatin (LIPITOR) 20 MG tablet Take 1 tablet (20 mg total) by mouth daily.  . B Complex Vitamins (VITAMIN-B COMPLEX PO) Take by mouth.  . Calcium Carbonate (CALCIUM 600 PO) Take by mouth.  . calcium carbonate (TUMS - DOSED IN MG ELEMENTAL CALCIUM) 500 MG chewable tablet Chew 1 tablet by mouth daily. As needed  . celecoxib (CELEBREX) 100 MG capsule Take 100 mg by mouth daily as needed.  . famotidine (PEPCID) 20 MG tablet Take 20 mg by mouth daily. As needed  .  meloxicam (MOBIC) 15 MG tablet Take 15 mg by mouth as needed.   . methocarbamol (ROBAXIN) 500 MG tablet Take 1 tablet (500 mg total) by mouth every 8 (eight) hours as needed for muscle spasms.  . Misc Natural Products (GLUCOSAMINE CHONDROITIN MSM) TABS Take by mouth.  . Multiple Vitamin (MULTIVITAMIN) tablet Take 1 tablet by mouth daily.  . pantoprazole (PROTONIX) 40 MG tablet TAKE 1 TABLET(40 MG) BY MOUTH DAILY  . RESTASIS 0.05 % ophthalmic emulsion INT 1 GTT IN OU BID   No facility-administered encounter medications on file as of 10/28/2017.     Activities of Daily Living In your present state of health, do you have any difficulty performing the following activities: 10/28/2017  Hearing? N  Vision? N  Difficulty concentrating or making decisions? N  Walking or climbing stairs? N  Dressing or bathing? N  Doing errands, shopping? N  Preparing Food and eating ? N  Using the Toilet? N  In the past six months, have you accidently leaked urine? N  Do you have problems with loss of bowel control? N  Managing your Medications? N  Managing your Finances? N  Housekeeping or managing your Housekeeping? N  Some recent data might be hidden    Patient Care Team: Eulas Post, MD as PCP - General (Family Medicine)   Assessment:   This is a routine wellness examination for Claire [name].  Exercise Activities and Dietary recommendations Current Exercise Habits: Home exercise routine;Structured exercise class, Time (Minutes): 40, Frequency (Times/Week): 5, Weekly Exercise (Minutes/Week): 200, Intensity: Moderate  Goals    . Exercise 150 min/wk Moderate Activity     Will increase exercise 30 min in the am and 30 min in the afternoon or after dinner        Fall Risk Fall Risk  10/28/2017 10/07/2016 10/07/2016 10/05/2015 10/05/2015  Falls in the past year? No No No Yes No  Number falls in past yr: - - - 1 -  Injury with Fall? - - - No -    Depression Screen PHQ 2/9 Scores 10/28/2017 10/07/2016  10/07/2016 10/05/2015  PHQ - 2 Score 0 0 0 0    Cognitive Function MMSE - Mini Mental State Exam 10/28/2017  Not completed: (No Data)     Ad8 score reviewed for issues:  Issues making decisions:  Less interest in hobbies / activities:  Repeats questions, stories (family complaining):  Trouble using  ordinary gadgets (microwave, computer, phone):  Forgets the month or year:   Mismanaging finances:   Remembering appts:  Daily problems with thinking and/or memory: Ad8 score is=0        Immunization History  Administered Date(s) Administered  . DT 09/02/2002, 09/02/2011  . Hepatitis A 02/16/2013, 07/27/2013  . Influenza, High Dose Seasonal PF 12/07/2015, 01/21/2017  . Influenza-Unspecified 02/05/2012, 01/04/2013, 01/30/2015  . Pneumococcal Conjugate-13 10/03/2014  . Pneumococcal Polysaccharide-23 10/22/2015  . Zoster 09/01/2008      Screening Tests Health Maintenance  Topic Date Due  . COLONOSCOPY  12/18/1998  . INFLUENZA VACCINE  10/29/2017  . MAMMOGRAM  12/11/2018  . TETANUS/TDAP  09/01/2021  . DEXA SCAN  Completed  . Hepatitis C Screening  Completed  . PNA vac Low Risk Adult  Completed         Plan:     PCP Notes   Health Maintenance On waiting list for shingrix  States colonoscopy was normal x 3 ti Dr. Cristina Gong; with Baltazar Najjar to call Dr. Cristina Gong to try to get schedule   She is to schedule mammogram for sept     Abnormal Screens  none  Referrals  none  Patient concerns; Seeing Dr. Elease Hashimoto 1:30 Had cold symptoms x 1 week; in the last few years, she starts with sore throat and Post nasal drip and sinus and cough started with clear and now greenish Temp around 100 at hs;  98.1 this am   Having more indigestion; taking more tums at hs Using pepcid bid   C/o of more OA  Also c/o that indigestion has gotten worse Taking pepcid in am and at hs and this helps   Nurse Concerns; As noted  Next PCP apt To be seen today for cold         I have personally reviewed and noted the following in the patient's chart:   . Medical and social history . Use of alcohol, tobacco or illicit drugs  . Current medications and supplements . Functional ability and status . Nutritional status . Physical activity . Advanced directives . List of other physicians . Hospitalizations, surgeries, and ER visits in previous 12 months . Vitals . Screenings to include cognitive, depression, and falls . Referrals and appointments  In addition, I have reviewed and discussed with patient certain preventive protocols, quality metrics, and best practice recommendations. A written personalized care plan for preventive services as well as general preventive health recommendations were provided to patient.     ASNKN,[email or web address], RN  10/28/2017  I have reviewed the documentation for the [facility] and [place] provided by the health coach and agree with their documentation. I was immediately available for any questions  Eulas Post MD [facility] at [facility]

## 2017-10-28 ENCOUNTER — Telehealth: Payer: Self-pay

## 2017-10-28 ENCOUNTER — Encounter: Payer: Self-pay | Admitting: Family Medicine

## 2017-10-28 ENCOUNTER — Ambulatory Visit (INDEPENDENT_AMBULATORY_CARE_PROVIDER_SITE_OTHER): Payer: Medicare Other | Admitting: Family Medicine

## 2017-10-28 ENCOUNTER — Ambulatory Visit (INDEPENDENT_AMBULATORY_CARE_PROVIDER_SITE_OTHER): Payer: Medicare Other

## 2017-10-28 VITALS — BP 106/66 | Temp 98.1°F | Wt 128.0 lb

## 2017-10-28 VITALS — BP 106/66 | HR 71 | Temp 98.1°F | Ht 61.0 in | Wt 128.0 lb

## 2017-10-28 DIAGNOSIS — Z0000 Encounter for general adult medical examination without abnormal findings: Secondary | ICD-10-CM

## 2017-10-28 DIAGNOSIS — R05 Cough: Secondary | ICD-10-CM | POA: Diagnosis not present

## 2017-10-28 DIAGNOSIS — R059 Cough, unspecified: Secondary | ICD-10-CM

## 2017-10-28 MED ORDER — BENZONATATE 100 MG PO CAPS: 100.0000 mg | ORAL_CAPSULE | Freq: Three times a day (TID) | ORAL | 0 refills | Status: DC | PRN

## 2017-10-28 MED ORDER — DOXYCYCLINE HYCLATE 100 MG PO CAPS
100.0000 mg | ORAL_CAPSULE | Freq: Two times a day (BID) | ORAL | 0 refills | Status: DC
Start: 2017-10-28 — End: 2018-01-25

## 2017-10-28 NOTE — Patient Instructions (Addendum)
Claire [name] , Thank you for taking time to come for your Medicare Wellness Visit. I appreciate your ongoing commitment to your health goals. Please review the following plan we discussed and let me know if I can assist you in the future.   Will make apt after Sept 12; for mammogram  You bone density was Osteopenic You can go to the [email or web address]   There is a new shingles shot - still on the waiting list  Shingrix is a vaccine for the prevention of Shingles in Adults 50 and older.  If you are on Medicare, the shingrix is covered under your Part D plan, so you will take both of the vaccines in the series at your pharmacy. Please check with your benefits regarding applicable copays or out of pocket expenses.  The Shingrix is given in 2 vaccines approx 8 weeks apart. You must receive the 2nd dose prior to 6 months from receipt of the first. Please have the pharmacist print out you Immunization  dates for our office records    These are the goals we discussed: Goals    . Exercise 150 min/wk Moderate Activity     Will increase exercise 30 min in the am and 30 min in the afternoon or after dinner        This is a list of the screening recommended for you and due dates:  Health Maintenance  Topic Date Due  . Colon Cancer Screening  12/18/1998  . Flu Shot  10/29/2017  . Mammogram  12/11/2018  . Tetanus Vaccine  09/01/2021  . DEXA scan (bone density measurement)  Completed  .  Hepatitis C: One time screening is recommended by Center for Disease Control  (CDC) for  adults born from [birth year] through 1967.   Completed  . Pneumonia vaccines  Completed     Fat and Cholesterol Restricted Diet High levels of fat and cholesterol in your blood may lead to various health problems, such as diseases of the heart, blood vessels, gallbladder, liver, and pancreas. Fats are concentrated sources of energy that come in various forms. Certain types of fat, including saturated fat, may be harmful in  excess. Cholesterol is a substance needed by your body in small amounts. Your body makes all the cholesterol it needs. Excess cholesterol comes from the food you eat. When you have high levels of cholesterol and saturated fat in your blood, health problems can develop because the excess fat and cholesterol will gather along the walls of your blood vessels, causing them to narrow. Choosing the right foods will help you control your intake of fat and cholesterol. This will help keep the levels of these substances in your blood within normal limits and reduce your risk of disease. What is my plan? Your health care provider recommends that you:  Limit your fat intake to ______% or less of your total calories per day.  Limit the amount of cholesterol in your diet to less than _________mg per day.  Eat 20-30 grams of fiber each day.  What types of fat should I choose?  Choose healthy fats more often. Choose monounsaturated and polyunsaturated fats, such as olive and canola oil, flaxseeds, walnuts, almonds, and seeds.  Eat more omega-3 fats. Good choices include salmon, mackerel, sardines, tuna, flaxseed oil, and ground flaxseeds. Aim to eat fish at least two times a week.  Limit saturated fats. Saturated fats are primarily found in animal products, such as meats, butter, and cream. Plant sources of saturated fats include  palm oil, palm kernel oil, and coconut oil.  Avoid foods with partially hydrogenated oils in them. These contain trans fats. Examples of foods that contain trans fats are stick margarine, some tub margarines, cookies, crackers, and other baked goods. What general guidelines do I need to follow? These guidelines for healthy eating will help you control your intake of fat and cholesterol:  Check food labels carefully to identify foods with trans fats or high amounts of saturated fat.  Fill one half of your plate with vegetables and green salads.  Fill one fourth of your plate with  whole grains. Look for the word "whole" as the first word in the ingredient list.  Fill one fourth of your plate with lean protein foods.  Limit fruit to two servings a day. Choose fruit instead of juice.  Eat more foods that contain fiber, such as apples, broccoli, carrots, beans, peas, and barley.  Eat more home-cooked food and less restaurant, buffet, and fast food.  Limit or avoid alcohol.  Limit foods high in starch and sugar.  Limit fried foods.  Cook foods using methods other than frying. Baking, boiling, grilling, and broiling are all great options.  Lose weight if you are overweight. Losing just 5-10% of your initial body weight can help your overall health and prevent diseases such as diabetes and heart disease.  What foods can I eat? Grains  Whole grains, such as whole wheat or whole grain breads, crackers, cereals, and pasta. Unsweetened oatmeal, bulgur, barley, quinoa, or brown rice. Corn or whole wheat flour tortillas. Vegetables  Fresh or frozen vegetables (raw, steamed, roasted, or grilled). Green salads. Fruits  All fresh, canned (in natural juice), or frozen fruits. Meats and other protein foods  Ground beef (85% or leaner), grass-fed beef, or beef trimmed of fat. Skinless chicken or [place]. Ground chicken or [place]. Pork trimmed of fat. All fish and seafood. Eggs. Dried beans, peas, or lentils. Unsalted nuts or seeds. Unsalted canned or dry beans. Dairy  Low-fat dairy products, such as skim or 1% milk, 2% or reduced-fat cheeses, low-fat ricotta or cottage cheese, or plain low-fat yo Fats and oils  Tub margarines without trans fats. Light or reduced-fat mayonnaise and salad dressings. Avocado. Olive, canola, sesame, or safflower oils. Natural peanut or almond butter (choose ones without added sugar and oil). The items listed above may not be a complete list of recommended foods or beverages. Contact your dietitian for more options. Foods to  avoid Grains  White bread. White pasta. White rice. Cornbread. Bagels, pastries, and croissants. Crackers that contain trans fat. Vegetables  White potatoes. Corn. Creamed or fried vegetables. Vegetables in a cheese sauce. Fruits  Dried fruits. Canned fruit in light or heavy syrup. Fruit juice. Meats and other protein foods  Fatty cuts of meat. Ribs, chicken wings, bacon, sausage, bologna, salami, chitterlings, fatback, hot dogs, bratwurst, and packaged luncheon meats. Liver and organ meats. Dairy  Whole or 2% milk, cream, half-and-half, and cream cheese. Whole milk cheeses. Whole-fat or sweetened yogurt. Full-fat cheeses. Nondairy creamers and whipped toppings. Processed cheese, cheese spreads, or cheese curds. Beverages  Alcohol. Sweetened drinks (such as sodas, lemonade, and fruit drinks or punches). Fats and oils  Butter, stick margarine, lard, shortening, ghee, or bacon fat. Coconut, palm kernel, or palm oils. Sweets and desserts  Corn syrup, sugars, honey, and molasses. Candy. Jam and jelly. Syrup. Sweetened cereals. Cookies, pies, cakes, donuts, muffins, and ice cream. The items listed above may not be a complete list of  foods and beverages to avoid. Contact your dietitian for more information. This information is not intended to replace advice given to you by your health care provider. Make sure you discuss any questions you have with your health care provider. Document Released: 03/17/2005 Document Revised: 04/07/2014 Document Reviewed: 06/15/2013 Elsevier Interactive Patient Education  2018 [place] for Type 2 Diabetes A screening test for type 2 diabetes (type 2 diabetes mellitus) is a blood test to measure your blood sugar (glucose) level. This test is done to check for early signs of diabetes, before you develop symptoms. Type 2 diabetes is a long-term (chronic) disease that occurs when the pancreas does not make enough of a hormone called insulin. This  results in high blood glucose levels, which can cause many complications. You may be screened for type 2 diabetes as part of your regular health care, especially if you have a high risk for diabetes. Screening can help identify type 2 diabetes at its early stage (prediabetes). Identifying and treating prediabetes may delay or prevent development of type 2 diabetes. What are the risk factors for type 2 diabetes? The following factors may make you more likely to develop type 2 diabetes:  Having a parent or sibling (first-degree relative) who has diabetes.  Being overweight or obese.  Being of American-Indian, [place], Hispanic, Latino, Asian, or African-American descent.  Not getting enough exercise.  Being older than 30.  Having a history of diabetes during pregnancy (gestational diabetes).  Having low levels of good cholesterol (HDL-C) or high levels of blood fats (triglycerides).  Having high blood glucose in a previous blood test.  Having high blood pressure.  Having certain diseases or conditions, including: ? Acanthosis nigricans. This is a condition that causes dark skin on the neck, armpits, and groin. ? Polycystic ovary syndrome (PCOS). ? Heart disease.  Having delivered a baby who weighed more than 9 lb (4.1 kg).  Who should be screened for type 2 diabetes? Adults  Adults age 5 and older. These adults should be screened at least once every three years.  Adults who are younger than 63, overweight, and have at least one other risk factor. These adults should be screened at least once every three years.  Adults who have normal blood glucose levels and two or more risk factors. These adults may be screened once every year (annually).  Women who have had gestational diabetes in the past. These women should be screened at least once every three years.  Pregnant women who have risk factors. These women should be screened at their first prenatal visit.  Pregnant  women with no risk factors. These women should be screened between weeks 24 and 28 of pregnancy. Children and adolescents  Children and adolescents should be screened for type 2 diabetes if they are overweight and have 2 of the following risk factors: ? A family history of type 2 diabetes. ? Being a member of a high risk race or ethnic group. ? Signs of insulin resistance or conditions associated with insulin resistance. ? A mother who had gestational diabetes while pregnant with him or her.  Screening should be done at least once every three years, starting at age 62. Your health care provider or your child's health care provider may recommend having a screening more or less often. What happens during screening? During screening, your health care provider may ask questions about:  Your health and your risk factors, including your activity level and any medical conditions that you  have.  The health of your first-degree relatives.  Past pregnancies, if this applies.  Your health care provider will also do a physical exam, including a blood pressure measurement and blood tests. There are four blood tests that can be used to screen for type 2 diabetes. You may have one or more of the following:  A fasting plasma glucose test (FBG). You will not be allowed to eat for at least eight hours before a blood sample is taken.  A random blood glucose test. This test checks your blood glucose at any time of the day regardless of when you ate.  An oral glucose tolerance test (OGTT). This test measures your blood glucose at two times: ? After you have not eaten (have fasted) overnight. ? Two hours after you drink a glucose-containing beverage. A diagnosis can be made if the level is greater than 200 mg/dL.  An A1c test. This test provides information about blood glucose control over the previous three months.  What do the results mean? Your test results are a measurement of how much glucose is in  your blood. Normal blood glucose levels mean that you do not have diabetes or prediabetes. High blood glucose levels may mean that you have prediabetes or diabetes. Depending on the results, other tests may be needed to confirm the diagnosis. This information is not intended to replace advice given to you by your health care provider. Make sure you discuss any questions you have with your health care provider. Document Released: 01/11/2009 Document Revised: 08/23/2015 Document Reviewed: 01/12/2015 Elsevier Interactive Patient Education  2017 [place] Maintenance, Female Adopting a healthy lifestyle and getting preventive care can go a long way to promote health and wellness. Talk with your health care provider about what schedule of regular examinations is right for you. This is a good chance for you to check in with your provider about disease prevention and staying healthy. In between checkups, there are plenty of things you can do on your own. Experts have done a lot of research about which lifestyle changes and preventive measures are most likely to keep you healthy. Ask your health care provider for more information. Weight and diet Eat a healthy diet  Be sure to include plenty of vegetables, fruits, low-fat dairy products, and lean protein.  Do not eat a lot of foods high in solid fats, added sugars, or salt.  Get regular exercise. This is one of the most important things you can do for your health. ? Most adults should exercise for at least 150 minutes each week. The exercise should increase your heart rate and make you sweat (moderate-intensity exercise). ? Most adults should also do strengthening exercises at least twice a week. This is in addition to the moderate-intensity exercise.  Maintain a healthy weight  Body mass index (BMI) is a measurement that can be used to identify possible weight problems. It estimates body fat based on height and weight. Your health care  provider can help determine your BMI and help you achieve or maintain a healthy weight.  For females 65 years of age and older: ? A BMI below 18.5 is considered underweight. ? A BMI of 18.5 to 24.9 is normal. ? A BMI of 25 to 29.9 is considered overweight. ? A BMI of 30 and above is considered obese.  Watch levels of cholesterol and blood lipids  You should start having your blood tested for lipids and cholesterol at 69 years of age, then have this  test every 5 years.  You may need to have your cholesterol levels checked more often if: ? Your lipid or cholesterol levels are high. ? You are older than 69 years of age. ? You are at high risk for heart disease.  Cancer screening Lung Cancer  Lung cancer screening is recommended for adults 56-16 years old who are at high risk for lung cancer because of a history of smoking.  A yearly low-dose CT scan of the lungs is recommended for people who: ? Currently smoke. ? Have quit within the past 15 years. ? Have at least a 30-pack-year history of smoking. A pack year is smoking an average of one pack of cigarettes a day for 1 year.  Yearly screening should continue until it has been 15 years since you quit.  Yearly screening should stop if you develop a health problem that would prevent you from having lung cancer treatment.  Breast Cancer  Practice breast self-awareness. This means understanding how your breasts normally appear and feel.  It also means doing regular breast self-exams. Let your health care provider know about any changes, no matter how small.  If you are in your 20s or 30s, you should have a clinical breast exam (CBE) by a health care provider every 1-3 years as part of a regular health exam.  If you are 26 or older, have a CBE every year. Also consider having a breast X-ray (mammogram) every year.  If you have a family history of breast cancer, talk to your health care provider about genetic screening.  If you are  at high risk for breast cancer, talk to your health care provider about having an MRI and a mammogram every year.  Breast cancer gene (BRCA) assessment is recommended for women who have family members with BRCA-related cancers. BRCA-related cancers include: ? Breast. ? Ovarian. ? Tubal. ? Peritoneal cancers.  Results of the assessment will determine the need for genetic counseling and BRCA1 and BRCA2 testing.  Cervical Cancer Your health care provider may recommend that you be screened regularly for cancer of the pelvic organs (ovaries, uterus, and vagina). This screening involves a pelvic examination, including checking for microscopic changes to the surface of your cervix (Pap test). You may be encouraged to have this screening done every 3 years, beginning at age 24.  For women ages 38-65, health care providers may recommend pelvic exams and Pap testing every 3 years, or they may recommend the Pap and pelvic exam, combined with testing for human papilloma virus (HPV), every 5 years. Some types of HPV increase your risk of cervical cancer. Testing for HPV may also be done on women of any age with unclear Pap test results.  Other health care providers may not recommend any screening for nonpregnant women who are considered low risk for pelvic cancer and who do not have symptoms. Ask your health care provider if a screening pelvic exam is right for you.  If you have had past treatment for cervical cancer or a condition that could lead to cancer, you need Pap tests and screening for cancer for at least 20 years after your treatment. If Pap tests have been discontinued, your risk factors (such as having a new sexual partner) need to be reassessed to determine if screening should resume. Some women have medical problems that increase the chance of getting cervical cancer. In these cases, your health care provider may recommend more frequent screening and Pap tests.  Colorectal Cancer  This type of  cancer can be detected and often prevented.  Routine colorectal cancer screening usually begins at 69 years of age and continues through 69 years of age.  Your health care provider may recommend screening at an earlier age if you have risk factors for colon cancer.  Your health care provider may also recommend using home test kits to check for hidden blood in the stool.  A small camera at the end of a tube can be used to examine your colon directly (sigmoidoscopy or colonoscopy). This is done to check for the earliest forms of colorectal cancer.  Routine screening usually begins at age 45.  Direct examination of the colon should be repeated every 5-10 years through 69 years of age. However, you may need to be screened more often if early forms of precancerous polyps or small growths are found.  Skin Cancer  Check your skin from head to toe regularly.  Tell your health care provider about any new moles or changes in moles, especially if there is a change in a mole's shape or color.  Also tell your health care provider if you have a mole that is larger than the size of a pencil eraser.  Always use sunscreen. Apply sunscreen liberally and repeatedly throughout the day.  Protect yourself by wearing long sleeves, pants, a wide-brimmed hat, and sunglasses whenever you are outside.  Heart disease, diabetes, and high blood pressure  High blood pressure causes heart disease and increases the risk of stroke. High blood pressure is more likely to develop in: ? People who have blood pressure in the high end of the normal range (130-139/85-89 mm Hg). ? People who are overweight or obese. ? People who are African American.  If you are 69-37 years of age, have your blood pressure checked every 3-5 years. If you are 39 years of age or older, have your blood pressure checked every year. You should have your blood pressure measured twice-once when you are at a hospital or clinic, and once when you are  not at a hospital or clinic. Record the average of the two measurements. To check your blood pressure when you are not at a hospital or clinic, you can use: ? An automated blood pressure machine at a pharmacy. ? A home blood pressure monitor.  If you are between 10 years and 3 years old, ask your health care provider if you should take aspirin to prevent strokes.  Have regular diabetes screenings. This involves taking a blood sample to check your fasting blood sugar level. ? If you are at a normal weight and have a low risk for diabetes, have this test once every three years after 69 years of age. ? If you are overweight and have a high risk for diabetes, consider being tested at a younger age or more often. Preventing infection Hepatitis B  If you have a higher risk for hepatitis B, you should be screened for this virus. You are considered at high risk for hepatitis B if: ? You were born in a country where hepatitis B is common. Ask your health care provider which countries are considered high risk. ? Your parents were born in a high-risk country, and you have not been immunized against hepatitis B (hepatitis B vaccine). ? You have HIV or AIDS. ? You use needles to inject street drugs. ? You live with someone who has hepatitis B. ? You have had sex with someone who has hepatitis B. ? You get hemodialysis treatment. ? You take  certain medicines for conditions, including cancer, organ transplantation, and autoimmune conditions.  Hepatitis C  Blood testing is recommended for: ? Everyone born from [birth year] through 1967. ? Anyone with known risk factors for hepatitis C.  Sexually transmitted infections (STIs)  You should be screened for sexually transmitted infections (STIs) including gonorrhea and chlamydia if: ? You are sexually active and are younger than 69 years of age. ? You are older than 69 years of age and your health care provider tells you that you are at risk for this type of  infection. ? Your sexual activity has changed since you were last screened and you are at an increased risk for chlamydia or gonorrhea. Ask your health care provider if you are at risk.  If you do not have HIV, but are at risk, it may be recommended that you take a prescription medicine daily to prevent HIV infection. This is called pre-exposure prophylaxis (PrEP). You are considered at risk if: ? You are sexually active and do not regularly use condoms or know the HIV status of your partner(s). ? You take drugs by injection. ? You are sexually active with a partner who has HIV.  Talk with your health care provider about whether you are at high risk of being infected with HIV. If you choose to begin PrEP, you should first be tested for HIV. You should then be tested every 3 months for as long as you are taking PrEP. Pregnancy  If you are premenopausal and you may become pregnant, ask your health care provider about preconception counseling.  If you may become pregnant, take 400 to 800 micrograms (mcg) of folic acid every day.  If you want to prevent pregnancy, talk to your health care provider about birth control (contraception). Osteoporosis and menopause  Osteoporosis is a disease in which the bones lose minerals and strength with aging. This can result in serious bone fractures. Your risk for osteoporosis can be identified using a bone density scan.  If you are 52 years of age or older, or if you are at risk for osteoporosis and fractures, ask your health care provider if you should be screened.  Ask your health care provider whether you should take a calcium or vitamin D supplement to lower your risk for osteoporosis.  Menopause may have certain physical symptoms and risks.  Hormone replacement therapy may reduce some of these symptoms and risks. Talk to your health care provider about whether hormone replacement therapy is right for you. Follow these instructions at home:  Schedule  regular health, dental, and eye exams.  Stay current with your immunizations.  Do not use any tobacco products including cigarettes, chewing tobacco, or electronic cigarettes.  If you are pregnant, do not drink alcohol.  If you are breastfeeding, limit how much and how often you drink alcohol.  Limit alcohol intake to no more than 1 drink per day for nonpregnant women. One drink equals 12 ounces of beer, 5 ounces of wine, or 1 ounces of hard liquor.  Do not use street drugs.  Do not share needles.  Ask your health care provider for help if you need support or information about quitting drugs.  Tell your health care provider if you often feel depressed.  Tell your health care provider if you have ever been abused or do not feel safe at home. This information is not intended to replace advice given to you by your health care provider. Make sure you discuss any questions you have with  your health care provider. Document Released: 09/30/2010 Document Revised: 08/23/2015 Document Reviewed: 12/19/2014 Elsevier Interactive Patient Education  [employer].

## 2017-10-28 NOTE — Telephone Encounter (Signed)
Call to Dr. Cristina Gong office  Staff confirmed that colonoscopy was

## 2017-10-28 NOTE — Patient Instructions (Signed)

## 2017-10-28 NOTE — Progress Notes (Signed)
  Subjective:     Patient ID: Claire [name], female   DOB: 1950-12-18, 67 y.o.   MRN: [id number]  HPI Patient seen for acute visit with over one week history of cough. She's also had some sinus congestion which has been progressive over the past week. Low-grade fever over past few days. Increasing right maxillary facial pain. Increased fatigue. Intermittent sore throat. Occasional aerate. Denies any nausea or vomiting. No headache. Penicillin allergy. Patient is nonsmoker.  Cough fairly severe at night and not relieved with NyQuil  Past Medical History:  Diagnosis Date  . Arthritis   . Cataracts, bilateral 2018   in November; dr. Tommy Rainwater  . GERD (gastroesophageal reflux disease)   . Hyperlipidemia   . Osteopenia   . Positive TB test    Past Surgical History:  Procedure Laterality Date  . BREAST BIOPSY Left    No noticable scar, Benign   . BREAST LUMPECTOMY Left 1996   benign  . [place]  . SHOULDER ARTHROSCOPY Left 2012   left  . WRIST FRACTURE SURGERY Right 2004   Right    reports that she has never smoked. She has never used smokeless tobacco. She reports that she does not drink alcohol. Her drug history is not on file. family history includes Hyperlipidemia in her father; Lymphoma in her brother. Allergies  Allergen Reactions  . Penicillins      Review of Systems  Constitutional: Positive for fatigue. Negative for chills and fever.  HENT: Positive for sinus pressure.   Respiratory: Positive for cough. Negative for shortness of breath and wheezing.   Cardiovascular: Negative for chest pain.       Objective:   Physical Exam  Constitutional: She appears well-developed and well-nourished.  HENT:  Right Ear: External ear normal.  Left Ear: External ear normal.  Mouth/Throat: Oropharynx is clear and moist.  Neck: Neck supple.  Cardiovascular: Normal rate and regular rhythm.  Pulmonary/Chest: Effort normal and breath sounds normal. No respiratory  distress. She has no wheezes. She has no rales.  Lymphadenopathy:    She has no cervical adenopathy.       Assessment:     Cough. Suspect acute sinusitis.    Plan:     -Tessalon Perles 100 mg every 8 hours as needed for cough -We discussed that sinusitis and acute bronchitis is usually viral. Consider doxycycline 100 mg twice a day for 10 days for persistent or progressive symptoms  Eulas Post MD [facility]

## 2017-11-02 ENCOUNTER — Ambulatory Visit: Payer: Medicare Other | Admitting: Orthotics

## 2017-11-02 DIAGNOSIS — M2012 Hallux valgus (acquired), left foot: Principal | ICD-10-CM

## 2017-11-02 DIAGNOSIS — M2011 Hallux valgus (acquired), right foot: Secondary | ICD-10-CM

## 2017-11-02 NOTE — Progress Notes (Signed)
Upon financial consideration, patient chose not to proceed w/ getting foot orthotics at this time due to Medicare not paying for them.

## 2017-12-02 MED FILL — SHINGRIX 50 MCG SUS: 50 | 1 days supply | Qty: 1 | Fill #0

## 2017-12-22 ENCOUNTER — Other Ambulatory Visit: Payer: Self-pay | Admitting: Family Medicine

## 2017-12-22 DIAGNOSIS — Z1231 Encounter for screening mammogram for malignant neoplasm of breast: Secondary | ICD-10-CM

## 2017-12-25 DIAGNOSIS — Z23 Encounter for immunization: Secondary | ICD-10-CM | POA: Diagnosis not present

## 2017-12-28 DIAGNOSIS — K219 Gastro-esophageal reflux disease without esophagitis: Secondary | ICD-10-CM | POA: Diagnosis not present

## 2017-12-28 DIAGNOSIS — K580 Irritable bowel syndrome with diarrhea: Secondary | ICD-10-CM | POA: Diagnosis not present

## 2018-01-01 ENCOUNTER — Encounter: Payer: Self-pay | Admitting: Family Medicine

## 2018-01-01 DIAGNOSIS — K219 Gastro-esophageal reflux disease without esophagitis: Secondary | ICD-10-CM | POA: Diagnosis not present

## 2018-01-01 DIAGNOSIS — K2930 Chronic superficial gastritis without bleeding: Secondary | ICD-10-CM | POA: Diagnosis not present

## 2018-01-01 DIAGNOSIS — R110 Nausea: Secondary | ICD-10-CM | POA: Diagnosis not present

## 2018-01-01 HISTORY — PX: ESOPHAGOGASTRODUODENOSCOPY ENDOSCOPY: SHX5814

## 2018-01-06 DIAGNOSIS — K2930 Chronic superficial gastritis without bleeding: Secondary | ICD-10-CM | POA: Diagnosis not present

## 2018-01-15 ENCOUNTER — Ambulatory Visit: Payer: Medicare Other | Admitting: Podiatry

## 2018-01-21 ENCOUNTER — Ambulatory Visit
Admission: RE | Admit: 2018-01-21 | Discharge: 2018-01-21 | Disposition: A | Discharge status: Home / Self Care | Payer: Medicare Other | Source: Ambulatory Visit | Attending: Family Medicine | Admitting: Family Medicine

## 2018-01-21 DIAGNOSIS — Z1231 Encounter for screening mammogram for malignant neoplasm of breast: Secondary | ICD-10-CM

## 2018-01-25 ENCOUNTER — Encounter: Payer: Self-pay | Admitting: Family Medicine

## 2018-01-25 ENCOUNTER — Ambulatory Visit (INDEPENDENT_AMBULATORY_CARE_PROVIDER_SITE_OTHER): Payer: Medicare Other | Admitting: Family Medicine

## 2018-01-25 ENCOUNTER — Other Ambulatory Visit: Payer: Self-pay

## 2018-01-25 VITALS — BP 104/64 | HR 71 | Temp 98.1°F | Ht 61.0 in | Wt 130.2 lb

## 2018-01-25 DIAGNOSIS — R197 Diarrhea, unspecified: Secondary | ICD-10-CM | POA: Diagnosis not present

## 2018-01-25 MED ORDER — CIPROFLOXACIN HCL 500 MG PO TABS: 500.0000 mg | ORAL_TABLET | Freq: Two times a day (BID) | ORAL | 0 refills | Status: DC

## 2018-01-25 NOTE — Progress Notes (Signed)
  Subjective:     Patient ID: Claire [name], female   DOB: 02/19/1951, 67 y.o.   MRN: [id number]  HPI Patient seen with frequent loose stools after recent travel to [place].  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  She states that her trip was from October 8-17.  She did not develop symptoms until about 5 days after she returned which would have been around the 22nd.  She has had about 4 low volume loose stools per day.  No bloody stools.  Some increased mucus.  No nausea or vomiting.  Denies fevers or chills.  She is getting back to her regular diet.  has been consuming some yogurt and drinking plenty of fluids.  Occasional lower abdominal cramp-like pains but no severe abdominal pain.  Past Medical History:  Diagnosis Date  . Arthritis   . Cataracts, bilateral 2018   in November; dr. Tommy Rainwater  . GERD (gastroesophageal reflux disease)   . Hyperlipidemia   . Osteopenia   . Positive TB test    Past Surgical History:  Procedure Laterality Date  . BREAST BIOPSY Left    No noticable scar, Benign   . BREAST LUMPECTOMY Left 1996   benign  . [place]  . ESOPHAGOGASTRODUODENOSCOPY ENDOSCOPY  01/01/2018  . SHOULDER ARTHROSCOPY Left 2012   left  . WRIST FRACTURE SURGERY Right 2004   Right    reports that she has never smoked. She has never used smokeless tobacco. She reports that she does not drink alcohol. Her drug history is not on file. family history includes Hyperlipidemia in her father; Lymphoma in her brother. Allergies  Allergen Reactions  . Penicillins   ]   Review of Systems  Constitutional: Negative for chills and fever.  Respiratory: Negative for shortness of breath.   Gastrointestinal: Positive for diarrhea. Negative for abdominal distention, blood in stool, constipation, nausea and vomiting.  Genitourinary: Negative for dysuria.       Objective:   Physical Exam  Constitutional: She appears well-developed and well-nourished.   Cardiovascular: Normal rate and regular rhythm.  Pulmonary/Chest: Breath sounds normal.  Abdominal: Soft. Normal appearance and bowel sounds are normal. She exhibits no mass. There is no tenderness.       Assessment:     Frequent loose stools after travel out of country.  No evidence clinically for dehydration.  Doubt parasitic given frequency of loose stools and about 4/day    Plan:     -Continue appropriate diet with bland foods and plenty of fluids -We discussed empiric trial of Cipro 500 mg twice daily for 3 days -Touch base if loose stools not resolving in 3 or 4 days -Follow-up sooner for any fever, increased abdominal pain, bloody stools, vomiting, or other concerns  Eulas Post MD  Primary Care at [facility]

## 2018-01-25 NOTE — Patient Instructions (Signed)
Stay well hydrated  Continue with natural probiotic sources such as Yogurt.     Follow up  Immediately for any bloody stool, fever, or increased abdominal pain  Take the Cipro 500 mg twice daily for 3 days  Let me know in 3-4 days if no better.

## 2018-02-18 MED FILL — SHINGRIX 50 MCG SUS: 50 | 1 days supply | Qty: 1 | Fill #1

## 2018-02-24 ENCOUNTER — Ambulatory Visit (INDEPENDENT_AMBULATORY_CARE_PROVIDER_SITE_OTHER): Payer: Medicare Other | Admitting: Family Medicine

## 2018-02-24 ENCOUNTER — Encounter: Payer: Self-pay | Admitting: Family Medicine

## 2018-02-24 VITALS — BP 122/80 | HR 64 | Temp 98.0°F | Ht 61.0 in | Wt 133.5 lb

## 2018-02-24 DIAGNOSIS — J019 Acute sinusitis, unspecified: Secondary | ICD-10-CM

## 2018-02-24 DIAGNOSIS — R739 Hyperglycemia, unspecified: Secondary | ICD-10-CM | POA: Diagnosis not present

## 2018-02-24 LAB — POCT GLYCOSYLATED HEMOGLOBIN (HGB A1C): Hemoglobin A1C: 5.8 % — AB (ref 4.0–5.6)

## 2018-02-24 MED ORDER — DOXYCYCLINE HYCLATE 100 MG PO CAPS: 100.0000 mg | ORAL_CAPSULE | Freq: Two times a day (BID) | ORAL | 0 refills | Status: DC

## 2018-02-24 MED ORDER — BENZONATATE 100 MG PO CAPS: 100.0000 mg | ORAL_CAPSULE | Freq: Three times a day (TID) | ORAL | 0 refills | Status: DC | PRN

## 2018-02-24 NOTE — Progress Notes (Signed)
  Subjective:     Patient ID: Claire [name], female   DOB: 02/14/1951, 67 y.o.   MRN: [id number]  HPI Onset of upper respiratory symptoms over 10 days ago.  Started with sore throat and then cough.  Now has sinus congestion with chills especially at night.  Feeling worse than she did a week ago.  Tylenol without relief.  Intermittent headache.  Greenish nasal discharge.  Cough worse at night.  Requesting refills of Tessalon.  Patient has history of hyperglycemia.  Requesting repeat A1c.  Past Medical History:  Diagnosis Date  . Arthritis   . Cataracts, bilateral 2018   in November; dr. Tommy Rainwater  . GERD (gastroesophageal reflux disease)   . Hyperlipidemia   . Osteopenia   . Positive TB test    Past Surgical History:  Procedure Laterality Date  . BREAST BIOPSY Left    No noticable scar, Benign   . BREAST LUMPECTOMY Left 1996   benign  . [place]  . ESOPHAGOGASTRODUODENOSCOPY ENDOSCOPY  01/01/2018  . SHOULDER ARTHROSCOPY Left 2012   left  . WRIST FRACTURE SURGERY Right 2004   Right    reports that she has never smoked. She has never used smokeless tobacco. She reports that she does not drink alcohol. Her drug history is not on file. family history includes Hyperlipidemia in her father; Lymphoma in her brother. Allergies  Allergen Reactions  . Penicillins      Review of Systems  Constitutional: Positive for chills and fatigue.  HENT: Positive for congestion, sinus pressure, sinus pain and sore throat.   Respiratory: Positive for cough.        Objective:   Physical Exam  Constitutional: She appears well-developed and well-nourished.  HENT:  Right Ear: External ear normal.  Left Ear: External ear normal.  Neck: Neck supple.  Cardiovascular: Normal rate and regular rhythm.  Pulmonary/Chest: Effort normal and breath sounds normal. She has no wheezes. She has no rales.  Lymphadenopathy:    She has no cervical adenopathy.       Assessment:     #1 probable  acute sinusitis  #2 hyperglycemia history    Plan:     -Doxycycline 100 mg twice daily for 10 days -Tessalon Perles 100 mg every 8 hours as needed for cough -Recheck A1c  Eulas Post MD [facility]

## 2018-02-24 NOTE — Patient Instructions (Signed)

## 2018-03-09 ENCOUNTER — Other Ambulatory Visit: Payer: Self-pay | Admitting: Family Medicine

## 2018-05-27 ENCOUNTER — Other Ambulatory Visit: Payer: Self-pay | Admitting: Family Medicine

## 2018-05-27 MED ORDER — ATORVASTATIN CALCIUM 20 MG PO TABS: ORAL_TABLET | ORAL | 1 refills | Status: DC

## 2018-05-27 MED ORDER — PANTOPRAZOLE SODIUM 40 MG PO TBEC: DELAYED_RELEASE_TABLET | ORAL | 1 refills | Status: DC

## 2018-05-27 NOTE — Telephone Encounter (Signed)
Copied from [place] [id number]. Topic: Quick Communication - Rx Refill/Question >> May 27, 2018  1:48 PM [place], [place] D wrote: Medication: atorvastatin (LIPITOR) 20 MG tablet / pantoprazole (PROTONIX) 40 MG tablet   Has the patient contacted their pharmacy? New Pharmacy (Agent: If no, request that the patient contact the pharmacy for the refill.) (Agent: If yes, when and what did the pharmacy advise?)  Preferred Pharmacy (with phone number or street name): [id number] - [place], [place] [phone] (Phone) [phone] (Fax)    Agent: Please be advised that RX refills may take up to 3 business days. We ask that you follow-up with your pharmacy.

## 2018-06-11 ENCOUNTER — Encounter: Payer: Self-pay | Admitting: Family Medicine

## 2018-06-11 ENCOUNTER — Other Ambulatory Visit: Payer: Self-pay

## 2018-06-11 ENCOUNTER — Ambulatory Visit: Payer: Medicare Other | Admitting: Family Medicine

## 2018-06-11 VITALS — BP 108/64 | HR 77 | Temp 98.3°F | Ht 61.0 in | Wt 131.4 lb

## 2018-06-11 DIAGNOSIS — R739 Hyperglycemia, unspecified: Secondary | ICD-10-CM | POA: Diagnosis not present

## 2018-06-11 DIAGNOSIS — J069 Acute upper respiratory infection, unspecified: Secondary | ICD-10-CM | POA: Diagnosis not present

## 2018-06-11 DIAGNOSIS — Z111 Encounter for screening for respiratory tuberculosis: Secondary | ICD-10-CM

## 2018-06-11 LAB — HEMOGLOBIN A1C: HEMOGLOBIN A1C: 6 % (ref 4.6–6.5)

## 2018-06-11 NOTE — Progress Notes (Signed)
  Subjective:     Patient ID: Claire [name], female   DOB: [birth year], 68 y.o.   MRN: [id number]  HPI Patient is seen for the following issues  Upper respiratory symptoms which started couple weeks ago.  Was around other family members with similar symptoms up in the [place] area.  She has had some nasal congestion, mild sore throat, mild cough.  No fever.  No dyspnea.  Mild body aches.  Requesting TB screen.  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  Needs to be screened.  History of BCG vaccine in childhood  History of prediabetes.  Requesting repeat A1c.  Well-controlled in the past.  Infrequently checks fasting blood sugars  Past Medical History:  Diagnosis Date  . Arthritis   . Cataracts, bilateral 2018   in November; dr. Tommy Rainwater  . GERD (gastroesophageal reflux disease)   . Hyperlipidemia   . Osteopenia   . Positive TB test    Past Surgical History:  Procedure Laterality Date  . BREAST BIOPSY Left    No noticable scar, Benign   . BREAST LUMPECTOMY Left 1996   benign  . [place]  . ESOPHAGOGASTRODUODENOSCOPY ENDOSCOPY  01/01/2018  . SHOULDER ARTHROSCOPY Left 2012   left  . WRIST FRACTURE SURGERY Right 2004   Right    reports that she has never smoked. She has never used smokeless tobacco. She reports that she does not drink alcohol. No history on file for drug. family history includes Hyperlipidemia in her father; Lymphoma in her brother. Allergies  Allergen Reactions  . Penicillins      Review of Systems  Constitutional: Negative for chills and fever.  HENT: Positive for congestion and sore throat.   Respiratory: Positive for cough.   Cardiovascular: Negative for chest pain.  Endocrine: Negative for polydipsia and polyuria.       Objective:   Physical Exam Constitutional:      Appearance: Normal appearance.  HENT:     Right Ear: Tympanic membrane normal.     Left Ear: Tympanic membrane normal.      Mouth/Throat:     Pharynx: Oropharynx is clear. No oropharyngeal exudate or posterior oropharyngeal erythema.  Neck:     Musculoskeletal: Neck supple.  Cardiovascular:     Rate and Rhythm: Normal rate and regular rhythm.  Pulmonary:     Effort: Pulmonary effort is normal. No respiratory distress.     Breath sounds: Normal breath sounds. No wheezing or rales.  Lymphadenopathy:     Cervical: No cervical adenopathy.  Neurological:     Mental Status: She is alert.        Assessment:     #1 acute viral upper respiratory infection.  Nonfocal exam.  She does not have any dyspnea or fever.  #2 request for TB screening.  Will need to be QuantiFERON gold assay with history of prior BCG vaccine  #3 history of prediabetes    Plan:     -Check QuantiFERON gold assay -Check hemoglobin A1c -Recommend plenty of fluids and rest and symptomatic treatment regarding her URI symptoms.  Follow-up for any fever or other changes symptoms  Eulas Post MD [facility] at [facility]

## 2018-06-13 ENCOUNTER — Encounter: Payer: Self-pay | Admitting: Family Medicine

## 2018-06-13 LAB — QUANTIFERON-TB GOLD PLUS
Mitogen-NIL: >10 IU/mL
NIL: 0.26 IU/mL
QuantiFERON-TB Gold Plus: NEGATIVE
TB1-NIL: <0 IU/mL
TB2-NIL: <0 IU/mL

## 2018-06-14 ENCOUNTER — Ambulatory Visit: Payer: Medicare Other | Admitting: Family Medicine

## 2018-06-14 ENCOUNTER — Encounter: Payer: Self-pay | Admitting: Family Medicine

## 2018-10-11 ENCOUNTER — Other Ambulatory Visit: Payer: Self-pay | Admitting: Family Medicine

## 2018-10-11 DIAGNOSIS — Z1231 Encounter for screening mammogram for malignant neoplasm of breast: Secondary | ICD-10-CM

## 2018-10-19 ENCOUNTER — Telehealth: Payer: Self-pay | Admitting: *Deleted

## 2018-10-19 ENCOUNTER — Encounter: Payer: Medicare Other | Admitting: Family Medicine

## 2018-10-19 NOTE — Telephone Encounter (Signed)
Copied from [place] [id number]. Topic: General - Other >> Oct 12, 2018 11:03 AM [place] Breath wrote: Reason for CRM: Patient called to schedule an appt.  Tried the office but no one picked up.  Patient would like an appt. As soon as possible.  CB# [phone]

## 2018-10-28 ENCOUNTER — Other Ambulatory Visit: Payer: Self-pay | Admitting: Family Medicine

## 2018-10-28 LAB — BASIC METABOLIC PANEL
BUN: 13 (ref 4–21)
Creatinine: 0.8 (ref 0.5–1.1)
Glucose: 93
Potassium: 4.6 (ref 3.4–5.3)
Sodium: 141 (ref 137–147)

## 2018-10-28 LAB — CBC AND DIFFERENTIAL
HCT: 41 (ref 36–46)
Hemoglobin: 13.3 (ref 12.0–16.0)
Platelets: 255 (ref 150–399)
WBC: 2.9

## 2018-10-28 LAB — HEPATIC FUNCTION PANEL
ALT: 25 (ref 7–35)
AST: 25 (ref 13–35)
Alkaline Phosphatase: 52 (ref 25–125)

## 2018-10-28 LAB — VITAMIN D 25 HYDROXY (VIT D DEFICIENCY, FRACTURES): Vit D, 25-Hydroxy: 66

## 2018-11-01 ENCOUNTER — Encounter: Payer: Medicare Other | Admitting: Family Medicine

## 2018-11-01 ENCOUNTER — Ambulatory Visit: Payer: Medicare Other

## 2018-11-09 ENCOUNTER — Other Ambulatory Visit: Payer: Self-pay

## 2018-11-09 ENCOUNTER — Ambulatory Visit (INDEPENDENT_AMBULATORY_CARE_PROVIDER_SITE_OTHER): Payer: Medicare Other | Admitting: Family Medicine

## 2018-11-09 ENCOUNTER — Ambulatory Visit: Payer: Medicare Other

## 2018-11-09 ENCOUNTER — Encounter: Payer: Self-pay | Admitting: Family Medicine

## 2018-11-09 VITALS — BP 134/82 | HR 64 | Temp 98.1°F | Ht 61.0 in | Wt 123.7 lb

## 2018-11-09 DIAGNOSIS — R7989 Other specified abnormal findings of blood chemistry: Secondary | ICD-10-CM | POA: Diagnosis not present

## 2018-11-09 DIAGNOSIS — R634 Abnormal weight loss: Secondary | ICD-10-CM | POA: Diagnosis not present

## 2018-11-09 DIAGNOSIS — E785 Hyperlipidemia, unspecified: Secondary | ICD-10-CM | POA: Diagnosis not present

## 2018-11-09 DIAGNOSIS — M19042 Primary osteoarthritis, left hand: Secondary | ICD-10-CM

## 2018-11-09 DIAGNOSIS — R739 Hyperglycemia, unspecified: Secondary | ICD-10-CM | POA: Diagnosis not present

## 2018-11-09 DIAGNOSIS — K219 Gastro-esophageal reflux disease without esophagitis: Secondary | ICD-10-CM

## 2018-11-09 DIAGNOSIS — M19041 Primary osteoarthritis, right hand: Secondary | ICD-10-CM | POA: Diagnosis not present

## 2018-11-09 LAB — BASIC METABOLIC PANEL
BUN: 17 mg/dL (ref 6–23)
CO2: 30 mEq/L (ref 19–32)
Calcium: 9.7 mg/dL (ref 8.4–10.5)
Chloride: 102 mEq/L (ref 96–112)
Creatinine, Ser: 0.73 mg/dL (ref 0.40–1.20)
GFR: 78.84 mL/min (ref >60.00)
Glucose, Bld: 98 mg/dL (ref 70–99)
Potassium: 4.4 mEq/L (ref 3.5–5.1)
Sodium: 139 mEq/L (ref 135–145)

## 2018-11-09 LAB — CBC WITH DIFFERENTIAL/PLATELET
Basophils Absolute: 0 10*3/uL (ref 0.0–0.1)
Basophils Relative: 0.9 % (ref 0.0–3.0)
Eosinophils Absolute: 0.1 10*3/uL (ref 0.0–0.7)
Eosinophils Relative: 3.8 % (ref 0.0–5.0)
HCT: 38.5 % (ref 36.0–46.0)
Hemoglobin: 12.9 g/dL (ref 12.0–15.0)
Lymphocytes Relative: 38.1 % (ref 12.0–46.0)
Lymphs Abs: 1.4 10*3/uL (ref 0.7–4.0)
MCHC: 33.5 g/dL (ref 30.0–36.0)
MCV: 92 fl (ref 78.0–100.0)
Monocytes Absolute: 0.3 10*3/uL (ref 0.1–1.0)
Monocytes Relative: 8.5 % (ref 3.0–12.0)
Neutro Abs: 1.7 10*3/uL (ref 1.4–7.7)
Neutrophils Relative %: 48.7 % (ref 43.0–77.0)
Platelets: 236 10*3/uL (ref 150.0–400.0)
RBC: 4.18 Mil/uL (ref 3.87–5.11)
RDW: 13.2 % (ref 11.5–15.5)
WBC: 3.6 10*3/uL — ABNORMAL LOW (ref 4.0–10.5)

## 2018-11-09 LAB — LIPID PANEL
Cholesterol: 170 mg/dL (ref 0–200)
HDL: 52.4 mg/dL (ref >39.00)
LDL Cholesterol: 87 mg/dL (ref 0–99)
NonHDL: 117.99
Total CHOL/HDL Ratio: 3
Triglycerides: 157 mg/dL — ABNORMAL HIGH (ref 0.0–149.0)
VLDL: 31.4 mg/dL (ref 0.0–40.0)

## 2018-11-09 LAB — HEMOGLOBIN A1C: Hgb A1c MFr Bld: 6.1 % (ref 4.6–6.5)

## 2018-11-09 LAB — TSH: TSH: 3.09 u[IU]/mL (ref 0.35–4.50)

## 2018-11-09 MED ORDER — TRIAMCINOLONE ACETONIDE 0.1 % EX CREA: 1.0000 "application " | TOPICAL_CREAM | Freq: Two times a day (BID) | CUTANEOUS | 1 refills | Status: AC | PRN

## 2018-11-09 NOTE — Progress Notes (Signed)
Subjective:     Patient ID: Claire [name], female   DOB: March 04, 1951, 68 y.o.   MRN: [id number]  HPI Patient has chronic problems including history of GERD, osteopenia, osteoarthritis, hyperlipidemia, hyperglycemia, elevated TSH.  She is currently battling small stress fracture metatarsal left foot.  She has seen orthopedics for that.  She had recent chemistries and CBC and vitamin D level.  Vitamin D level is good.  She has taken Fosamax in the past.  She had bone density scan September 2018 which showed osteopenia with T score -2.1.  She recently saw gynecologist and there was discussion about reinitiating Fosamax possibly.  She does have significant GERD and takes Protonix for that.  GERD symptoms currently controlled.  She saw GI last year and had EGD.  Hyperlipidemia treated with Lipitor.  No recent myalgias.  She did have mild low white count of 2.9 thousand when she had recent CBC when she was concerned regarding that.  She is not had any recent infectious symptoms.  She has small scaly pruritic patch of rash in her right occipital area.  She used her husband's triamcinolone 0.1% cream which seemed to help.  She has had some weight loss about 10 pounds over the past several months.  She thinks some of this may be lifestyle.  Her daughter and her family were living with him for a few months and she was more busy chasing 60-year-old grandchild.  She has good appetite and generally feels well. She does have mild hyperglycemia as above but no recent polyuria or polydipsia.  Past Medical History:  Diagnosis Date  . Arthritis   . Cataracts, bilateral 2018   in November; dr. Tommy Rainwater  . GERD (gastroesophageal reflux disease)   . Hyperlipidemia   . Osteopenia   . Positive TB test    Past Surgical History:  Procedure Laterality Date  . BREAST BIOPSY Left    No noticable scar, Benign   . BREAST LUMPECTOMY Left 1996   benign  . [place]  . ESOPHAGOGASTRODUODENOSCOPY ENDOSCOPY   01/01/2018  . SHOULDER ARTHROSCOPY Left 2012   left  . WRIST FRACTURE SURGERY Right 2004   Right    reports that she has never smoked. She has never used smokeless tobacco. She reports that she does not drink alcohol. No history on file for drug. family history includes Hyperlipidemia in her father; Lymphoma in her brother. Allergies  Allergen Reactions  . Penicillins      Review of Systems  Constitutional: Negative for appetite change, chills, fatigue and fever.  HENT: Negative for trouble swallowing.   Eyes: Negative for visual disturbance.  Respiratory: Negative for cough, chest tightness, shortness of breath and wheezing.   Cardiovascular: Negative for chest pain, palpitations and leg swelling.  Gastrointestinal: Negative for abdominal pain, diarrhea, nausea and vomiting.  Endocrine: Negative for polydipsia and polyuria.  Genitourinary: Negative for dysuria.  Skin: Positive for rash.  Neurological: Negative for dizziness, seizures, syncope, weakness, light-headedness and headaches.  Hematological: Negative for adenopathy.       Objective:   Physical Exam Constitutional:      Appearance: She is well-developed.  Eyes:     Pupils: Pupils are equal, round, and reactive to light.  Neck:     Musculoskeletal: Neck supple.     Thyroid: No thyromegaly.     Vascular: No JVD.  Cardiovascular:     Rate and Rhythm: Normal rate and regular rhythm.     Heart sounds: No gallop.  Pulmonary:     Effort: Pulmonary effort is normal. No respiratory distress.     Breath sounds: Normal breath sounds. No wheezing or rales.  Abdominal:     Palpations: Abdomen is soft. There is no mass.     Tenderness: There is no abdominal tenderness. There is no guarding or rebound.     Hernia: No hernia is present.  Musculoskeletal:     Right lower leg: No edema.     Left lower leg: No edema.  Skin:    Findings: Rash present.     Comments: She has small nummular eczema rash right occipital area  about 1 cm diameter.  Slightly scaly surface.  Neurological:     Mental Status: She is alert.        Assessment:     #1 hyperlipidemia which is treated with Lipitor  #2 history of GERD which is controlled with her tonics  #3 osteopenia.  DEXA scan was about 2 years ago with T score -2.1  #4 recent weight loss probably related to some lifestyle changes.  She does not have any worrisome decreased appetite  #5 history of mild hyperglycemia  #6 small eczema-like rash right occipital area    Plan:     -Check further labs with lipids, A1c, CBC, TSH -Discussed importance of regular weightbearing exercise -Consider follow-up DEXA scan later this year -Triamcinolone 0.1% cream twice daily as needed -She has repeat mammogram scheduled already for this fall -Other health maintenance up-to-date-with exception of flu vaccine which she will get in September  Eulas Post MD [facility]

## 2018-12-25 IMAGING — MG DIGITAL SCREENING BILATERAL MAMMOGRAM WITH TOMO AND CAD
8 series · 9 of 24 positions shown · non-contrast
Comparison: Previous exam(s).

CLINICAL DATA: Screening.

EXAM:
DIGITAL SCREENING BILATERAL MAMMOGRAM WITH TOMO AND CAD

[L CC synth-2D]
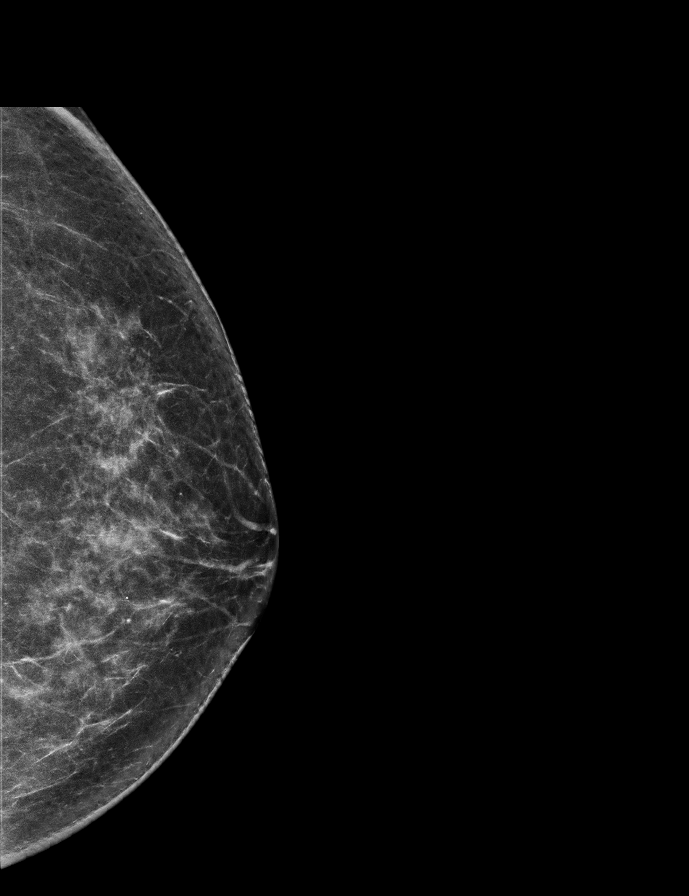

[R MLO synth-2D]
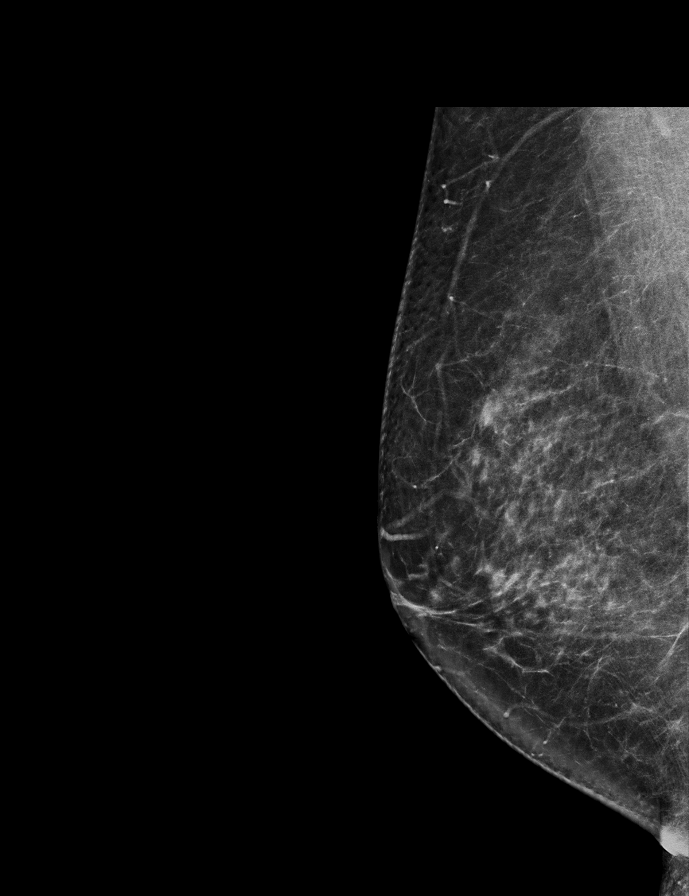

[R CC synth-2D]
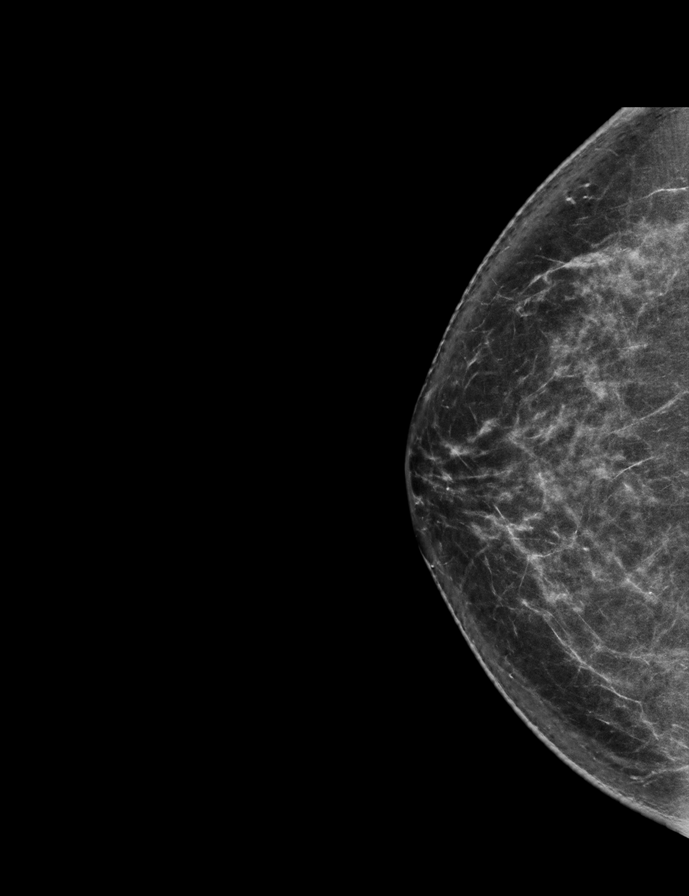

[L MLO synth-2D]
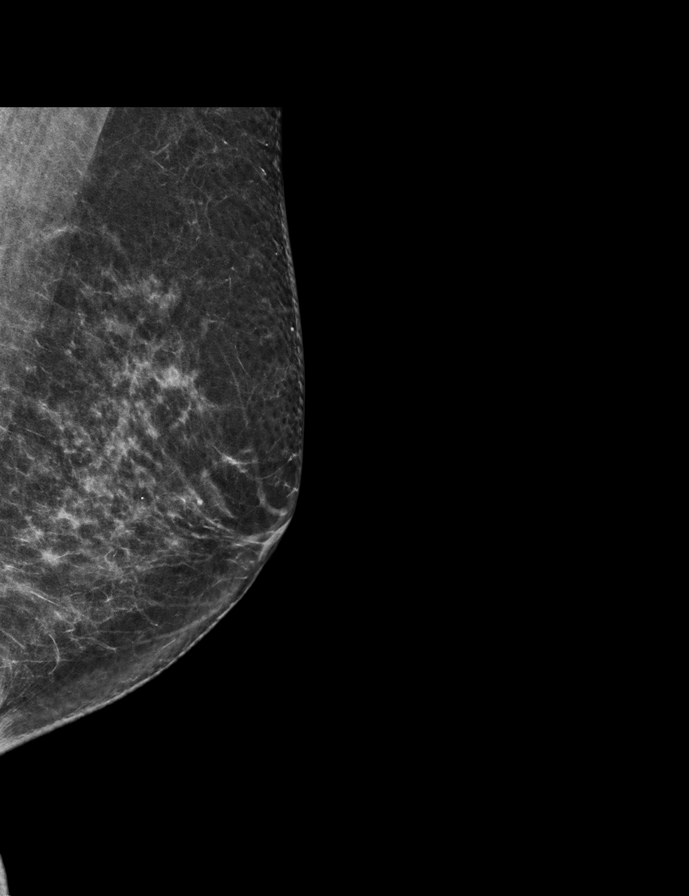

[L CC tomo · 2 of 70 frames shown]
[frame 23/70]
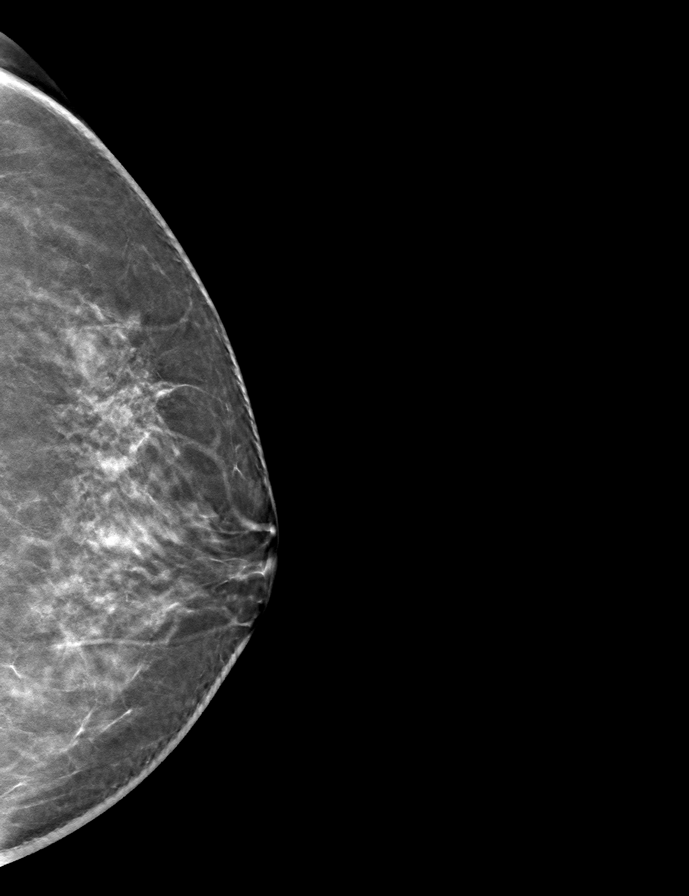
[frame 35/70]
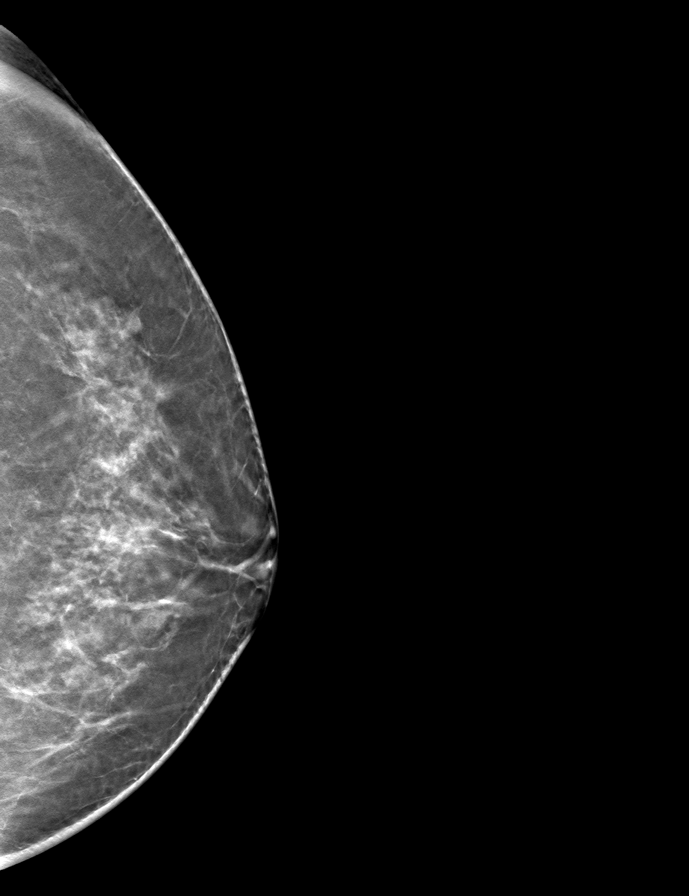

[R CC tomo · tomo slice 37/74.0]
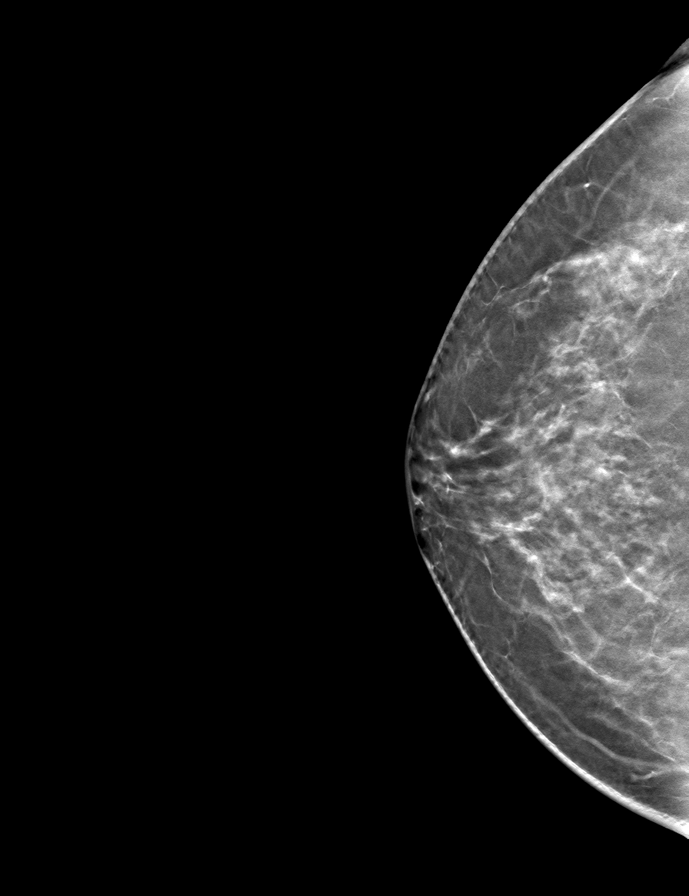

[R MLO tomo · tomo slice 39/77.0]
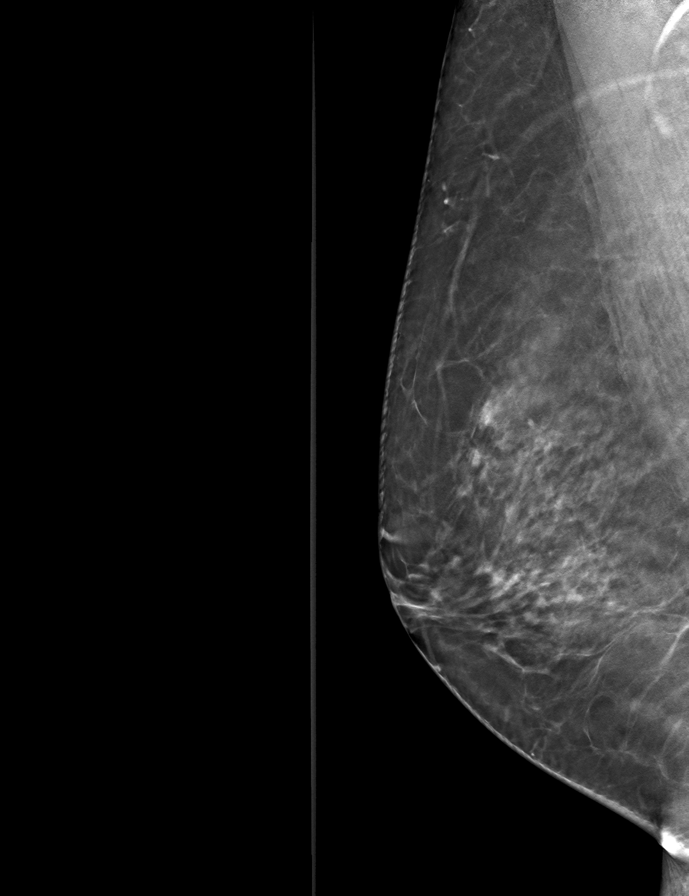

[L MLO tomo · tomo slice 34/67.0]
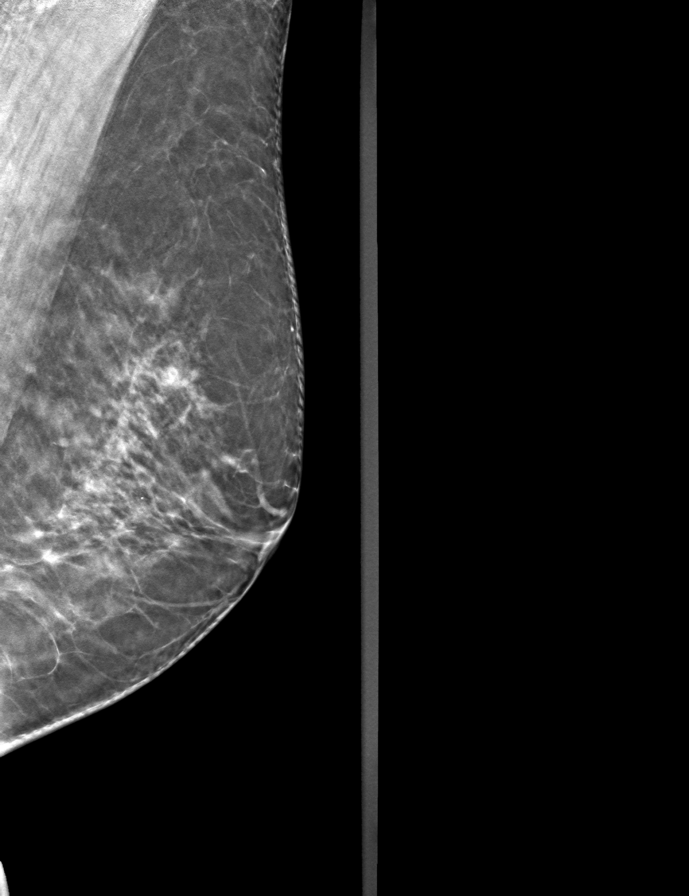

[9 of 24 positions shown; findings below may reference images not displayed]

ACR Breast Density Category c: The breast tissue is heterogeneously
dense, which may obscure small masses.
FINDINGS: There are no findings suspicious for malignancy. Images were
processed with CAD.
IMPRESSION: No mammographic evidence of malignancy. A result letter of this
screening mammogram will be mailed directly to the patient.

RECOMMENDATION:
Screening mammogram in one year. (Code:FT-U-LHB)

BI-RADS CATEGORY  1: Negative.

## 2019-01-11 ENCOUNTER — Other Ambulatory Visit: Payer: Self-pay | Admitting: Family Medicine

## 2019-01-25 ENCOUNTER — Other Ambulatory Visit: Payer: Self-pay

## 2019-01-25 ENCOUNTER — Ambulatory Visit
Admission: RE | Admit: 2019-01-25 | Discharge: 2019-01-25 | Disposition: A | Discharge status: Home / Self Care | Payer: Medicare Other | Source: Ambulatory Visit | Attending: Family Medicine | Admitting: Family Medicine

## 2019-01-25 DIAGNOSIS — Z1231 Encounter for screening mammogram for malignant neoplasm of breast: Secondary | ICD-10-CM

## 2019-02-16 ENCOUNTER — Other Ambulatory Visit: Payer: Self-pay

## 2019-02-16 ENCOUNTER — Ambulatory Visit: Payer: Medicare Other | Admitting: Family Medicine

## 2019-02-16 ENCOUNTER — Encounter: Payer: Self-pay | Admitting: Family Medicine

## 2019-02-16 VITALS — BP 108/62 | HR 81 | Temp 97.3°F | Ht 61.0 in | Wt 124.1 lb

## 2019-02-16 DIAGNOSIS — R739 Hyperglycemia, unspecified: Secondary | ICD-10-CM | POA: Diagnosis not present

## 2019-02-16 DIAGNOSIS — R1031 Right lower quadrant pain: Secondary | ICD-10-CM | POA: Diagnosis not present

## 2019-02-16 LAB — CBC WITH DIFFERENTIAL/PLATELET
Basophils Absolute: 0 10*3/uL (ref 0.0–0.1)
Basophils Relative: 0.2 % (ref 0.0–3.0)
Eosinophils Absolute: 0.2 10*3/uL (ref 0.0–0.7)
Eosinophils Relative: 4.5 % (ref 0.0–5.0)
HCT: 39.8 % (ref 36.0–46.0)
Hemoglobin: 13.2 g/dL (ref 12.0–15.0)
Lymphocytes Relative: 39.5 % (ref 12.0–46.0)
Lymphs Abs: 1.8 10*3/uL (ref 0.7–4.0)
MCHC: 33.1 g/dL (ref 30.0–36.0)
MCV: 92.1 fl (ref 78.0–100.0)
Monocytes Absolute: 0.4 10*3/uL (ref 0.1–1.0)
Monocytes Relative: 8.2 % (ref 3.0–12.0)
Neutro Abs: 2.2 10*3/uL (ref 1.4–7.7)
Neutrophils Relative %: 47.6 % (ref 43.0–77.0)
Platelets: 280 10*3/uL (ref 150.0–400.0)
RBC: 4.32 Mil/uL (ref 3.87–5.11)
RDW: 13.1 % (ref 11.5–15.5)
WBC: 4.6 10*3/uL (ref 4.0–10.5)

## 2019-02-16 LAB — POCT URINALYSIS DIPSTICK
Bilirubin, UA: NEGATIVE
Blood, UA: NEGATIVE
Glucose, UA: NEGATIVE
Ketones, UA: NEGATIVE
Leukocytes, UA: NEGATIVE
Nitrite, UA: NEGATIVE
Protein, UA: NEGATIVE
Spec Grav, UA: 1.015 (ref 1.010–1.025)
Urobilinogen, UA: 0.2 E.U./dL
pH, UA: 6.5 (ref 5.0–8.0)

## 2019-02-16 LAB — HEMOGLOBIN A1C: Hgb A1c MFr Bld: 5.9 % (ref 4.6–6.5)

## 2019-02-16 NOTE — Patient Instructions (Signed)
Abdominal Pain, Adult Abdominal pain can be caused by many things. Often, abdominal pain is not serious and it gets better with no treatment or by being treated at home. However, sometimes abdominal pain is serious. Your health care provider will do a medical history and a physical exam to try to determine the cause of your abdominal pain. Follow these instructions at home:  Take over-the-counter and prescription medicines only as told by your health care provider. Do not take a laxative unless told by your health care provider.  Drink enough fluid to keep your urine clear or pale yellow.  Watch your condition for any changes.  Keep all follow-up visits as told by your health care provider. This is important. Contact a health care provider if:  Your abdominal pain changes or gets worse.  You are not hungry or you lose weight without trying.  You are constipated or have diarrhea for more than 2-3 days.  You have pain when you urinate or have a bowel movement.  Your abdominal pain wakes you up at night.  Your pain gets worse with meals, after eating, or with certain foods.  You are throwing up and cannot keep anything down.  You have a fever. Get help right away if:  Your pain does not go away as soon as your health care provider told you to expect.  You cannot stop throwing up.  Your pain is only in areas of the abdomen, such as the right side or the left lower portion of the abdomen.  You have bloody or black stools, or stools that look like tar.  You have severe pain, cramping, or bloating in your abdomen.  You have signs of dehydration, such as: ? Dark urine, very little urine, or no urine. ? Cracked lips. ? Dry mouth. ? Sunken eyes. ? Sleepiness. ? Weakness. This information is not intended to replace advice given to you by your health care provider. Make sure you discuss any questions you have with your health care provider. Document Released: 12/25/2004 Document  Revised: 10/05/2015 Document Reviewed: 08/29/2015 Elsevier Interactive Patient Education  2020 [employer] Inc[employer]  

## 2019-02-16 NOTE — Progress Notes (Signed)
Subjective:     Patient ID: Claire [name], female   DOB: Jul 14, 1950, 68 y.o.   MRN: [id number]  HPI Claire [name] is seen today with onset couple days ago of some right lower quadrant abdominal pain.  She had very similar pain 4 months ago and she saw her GYN and had urine culture which was negative.  That pain eventually went away after several days.  She has no pain whatsoever today.  No fever.  No chills.  No dysuria.  She described this as a "pressure pain ".  She had some mild nausea but no vomiting.  No change in bowel habits.  No history of kidney stones.  Still has her appendix.  She states she has both ovaries.  No recent appetite or weight changes.  She had colonoscopy 2014 and reportedly no diverticulosis history.  We do not have that report.  She noted that her pain couple days ago was worse with movement.  No alleviating factors.  Past Medical History:  Diagnosis Date  . Arthritis   . Cataracts, bilateral 2018   in November; dr. Tommy Rainwater  . GERD (gastroesophageal reflux disease)   . Hyperlipidemia   . Osteopenia   . Positive TB test    Past Surgical History:  Procedure Laterality Date  . BREAST BIOPSY Left    No noticable scar, Benign   . BREAST EXCISIONAL BIOPSY Left 1996  . [place]  . ESOPHAGOGASTRODUODENOSCOPY ENDOSCOPY  01/01/2018  . SHOULDER ARTHROSCOPY Left 2012   left  . WRIST FRACTURE SURGERY Right 2004   Right    reports that she has never smoked. She has never used smokeless tobacco. She reports that she does not drink alcohol. No history on file for drug. family history includes Hyperlipidemia in her father; Lymphoma in her brother. Allergies  Allergen Reactions  . Penicillins      Review of Systems  Constitutional: Negative for appetite change, chills, fever and unexpected weight change.  Respiratory: Negative for shortness of breath.   Cardiovascular: Negative for chest pain.  Gastrointestinal: Positive for abdominal pain. Negative for  abdominal distention, blood in stool, constipation, diarrhea and vomiting.  Genitourinary: Negative for dysuria, flank pain, hematuria and pelvic pain.       Objective:   Physical Exam Constitutional:      Appearance: She is well-developed.  Cardiovascular:     Rate and Rhythm: Normal rate and regular rhythm.  Pulmonary:     Effort: Pulmonary effort is normal.     Breath sounds: Normal breath sounds.  Abdominal:     General: Bowel sounds are normal. There is no distension.     Tenderness: There is no abdominal tenderness. There is no guarding or rebound.     Hernia: No hernia is present. There is no hernia in the umbilical area.  Neurological:     Mental Status: She is alert.        Assessment:     #1 transient right lower quadrant abdominal pain.  Benign exam at this time.  Clinically, doubt appendicitis.  No history of diverticular disease.  She does not have any red flags such as weight loss, fever, hematuria, etc.  #2 history of hyperglycemia.  Patient requesting repeat A1c today    Plan:     -Check further labs with urine dipstick, CBC with differential, A1c -If above normal recommend observation for now.  Follow-up promptly for progressive pain, fever or any other concerns  Eulas Post MD [facility] at [facility]

## 2019-02-17 ENCOUNTER — Encounter: Payer: Self-pay | Admitting: Family Medicine

## 2019-02-18 ENCOUNTER — Ambulatory Visit: Payer: Medicare Other | Admitting: Family Medicine

## 2019-03-02 ENCOUNTER — Telehealth (INDEPENDENT_AMBULATORY_CARE_PROVIDER_SITE_OTHER): Payer: Medicare Other | Admitting: Family Medicine

## 2019-03-02 ENCOUNTER — Other Ambulatory Visit: Payer: Self-pay

## 2019-03-02 DIAGNOSIS — J019 Acute sinusitis, unspecified: Secondary | ICD-10-CM | POA: Diagnosis not present

## 2019-03-02 MED ORDER — DOXYCYCLINE HYCLATE 100 MG PO CAPS: 100.0000 mg | ORAL_CAPSULE | Freq: Two times a day (BID) | ORAL | 0 refills | Status: DC

## 2019-03-02 NOTE — Progress Notes (Signed)
This visit type was conducted due to national recommendations for restrictions regarding the COVID-19 pandemic in an effort to limit this patient's exposure and mitigate transmission in our community.    Virtual Visit via Telephone Note  I connected with [place] on 03/02/19 at 11:45 AM EST by telephone and verified that I am speaking with the correct person using two identifiers.   I discussed the limitations, risks, security and privacy concerns of performing an evaluation and management service by telephone and the availability of in person appointments. I also discussed with the patient that there may be a patient responsible charge related to this service. The patient expressed understanding and agreed to proceed.  Location patient: home Location provider: work or home office Participants present for the call: patient, provider Patient did not have a visit in the prior 7 days to address this/these issue(s).   History of Present Illness: Claire [name] had onset last Friday of some sore throat followed by some nasal congestion, body aches, cough. No true fever but she has had temperature 99.2. Recent history is that she was up in the [place] area with family for Thanksgiving. She states her 50-year-old grandchild had a "cold ". No known Covid exposures. She has had persistent symptoms since returning last Friday. She has scheduled Covid testing [facility] local [facility] tomorrow. Her husband has similar though milder symptoms. She denies any dyspnea.  No diarrhea. Her concern is that she has had some bloody sinus discharging initial clear nasal discharge and now thick green mucus. She has had history of sinusitis in the past and is concerned this is turning into a "sinus infection ". She and her husband are planning to fly out in about 10 days to [place]  Past Medical History:  Diagnosis Date  . Arthritis   . Cataracts, bilateral 2018   in November; dr. Tommy Rainwater  . GERD (gastroesophageal reflux disease)    . Hyperlipidemia   . Osteopenia   . Positive TB test    Past Surgical History:  Procedure Laterality Date  . BREAST BIOPSY Left    No noticable scar, Benign   . BREAST EXCISIONAL BIOPSY Left 1996  . [place]  . ESOPHAGOGASTRODUODENOSCOPY ENDOSCOPY  01/01/2018  . SHOULDER ARTHROSCOPY Left 2012   left  . WRIST FRACTURE SURGERY Right 2004   Right    reports that she has never smoked. She has never used smokeless tobacco. She reports that she does not drink alcohol. No history on file for drug. family history includes Hyperlipidemia in her father; Lymphoma in her brother. Allergies  Allergen Reactions  . Penicillins       Observations/Objective: Patient sounds cheerful and well on the phone. I do not appreciate any SOB. Speech and thought processing are grossly intact. Patient reported vitals:  Assessment and Plan:  Upper respiratory illness. At this point does not have any dyspnea but has had some progressive sinusitis symptoms. Need to rule out COVID-19 infection.  -Agree with Covid testing which she has lined up for tomorrow -Plenty of fluids and rest -Follow-up for increased dyspnea or other concerns -She knows to stay quarantined until further clarification of her Covid status -Doxycycline 100 mg twice daily given progressive sinus symptoms above  Follow Up Instructions:  -As above   99441 5-10 [id number] 11-20 [id number] 21-30 I did not refer this patient for an OV in the next 24 hours for this/these issue(s).  I discussed the assessment and treatment plan with the patient. The patient  was provided an opportunity to ask questions and all were answered. The patient agreed with the plan and demonstrated an understanding of the instructions.   The patient was advised to call back or seek an in-person evaluation if the symptoms worsen or if the condition fails to improve as anticipated.  I provided 13 minutes of non-face-to-face time during this  encounter.   Carolann Littler, MD

## 2019-04-08 ENCOUNTER — Other Ambulatory Visit: Payer: Self-pay | Admitting: Family Medicine

## 2019-05-02 DIAGNOSIS — Z8673 Personal history of transient ischemic attack (TIA), and cerebral infarction without residual deficits: Secondary | ICD-10-CM

## 2019-05-02 HISTORY — DX: Personal history of transient ischemic attack (TIA), and cerebral infarction without residual deficits: Z86.73

## 2019-05-22 DIAGNOSIS — R4182 Altered mental status, unspecified: Secondary | ICD-10-CM | POA: Diagnosis not present

## 2019-05-22 DIAGNOSIS — M1990 Unspecified osteoarthritis, unspecified site: Secondary | ICD-10-CM | POA: Diagnosis not present

## 2019-05-22 DIAGNOSIS — G459 Transient cerebral ischemic attack, unspecified: Secondary | ICD-10-CM | POA: Diagnosis not present

## 2019-05-22 DIAGNOSIS — R410 Disorientation, unspecified: Secondary | ICD-10-CM | POA: Diagnosis not present

## 2019-05-22 DIAGNOSIS — I10 Essential (primary) hypertension: Secondary | ICD-10-CM | POA: Diagnosis not present

## 2019-05-22 DIAGNOSIS — K219 Gastro-esophageal reflux disease without esophagitis: Secondary | ICD-10-CM | POA: Diagnosis not present

## 2019-05-22 DIAGNOSIS — E7800 Pure hypercholesterolemia, unspecified: Secondary | ICD-10-CM | POA: Diagnosis not present

## 2019-05-22 DIAGNOSIS — Z880 Allergy status to penicillin: Secondary | ICD-10-CM | POA: Diagnosis not present

## 2019-05-22 DIAGNOSIS — Z7982 Long term (current) use of aspirin: Secondary | ICD-10-CM | POA: Diagnosis not present

## 2019-05-22 DIAGNOSIS — R519 Headache, unspecified: Secondary | ICD-10-CM | POA: Diagnosis not present

## 2019-05-22 DIAGNOSIS — E785 Hyperlipidemia, unspecified: Secondary | ICD-10-CM | POA: Diagnosis not present

## 2019-05-22 DIAGNOSIS — R531 Weakness: Secondary | ICD-10-CM | POA: Diagnosis not present

## 2019-05-23 DIAGNOSIS — Z7689 Persons encountering health services in other specified circumstances: Secondary | ICD-10-CM | POA: Diagnosis not present

## 2019-05-23 DIAGNOSIS — K219 Gastro-esophageal reflux disease without esophagitis: Secondary | ICD-10-CM | POA: Diagnosis not present

## 2019-05-23 DIAGNOSIS — Z111 Encounter for screening for respiratory tuberculosis: Secondary | ICD-10-CM | POA: Diagnosis not present

## 2019-05-23 DIAGNOSIS — E785 Hyperlipidemia, unspecified: Secondary | ICD-10-CM | POA: Diagnosis not present

## 2019-05-23 DIAGNOSIS — R7303 Prediabetes: Secondary | ICD-10-CM | POA: Diagnosis not present

## 2019-05-23 DIAGNOSIS — G459 Transient cerebral ischemic attack, unspecified: Secondary | ICD-10-CM | POA: Diagnosis not present

## 2019-05-23 DIAGNOSIS — R252 Cramp and spasm: Secondary | ICD-10-CM | POA: Diagnosis not present

## 2019-05-25 DIAGNOSIS — M1990 Unspecified osteoarthritis, unspecified site: Secondary | ICD-10-CM | POA: Diagnosis not present

## 2019-05-25 DIAGNOSIS — R0600 Dyspnea, unspecified: Secondary | ICD-10-CM | POA: Diagnosis not present

## 2019-05-25 DIAGNOSIS — R0602 Shortness of breath: Secondary | ICD-10-CM | POA: Diagnosis not present

## 2019-05-25 DIAGNOSIS — K219 Gastro-esophageal reflux disease without esophagitis: Secondary | ICD-10-CM | POA: Diagnosis not present

## 2019-05-25 DIAGNOSIS — Z880 Allergy status to penicillin: Secondary | ICD-10-CM | POA: Diagnosis not present

## 2019-05-25 DIAGNOSIS — R079 Chest pain, unspecified: Secondary | ICD-10-CM | POA: Diagnosis not present

## 2019-05-25 DIAGNOSIS — Z8249 Family history of ischemic heart disease and other diseases of the circulatory system: Secondary | ICD-10-CM | POA: Diagnosis not present

## 2019-05-25 DIAGNOSIS — Z780 Asymptomatic menopausal state: Secondary | ICD-10-CM | POA: Diagnosis not present

## 2019-05-25 DIAGNOSIS — E7800 Pure hypercholesterolemia, unspecified: Secondary | ICD-10-CM | POA: Diagnosis not present

## 2019-05-25 DIAGNOSIS — Z7982 Long term (current) use of aspirin: Secondary | ICD-10-CM | POA: Diagnosis not present

## 2019-06-02 DIAGNOSIS — R29818 Other symptoms and signs involving the nervous system: Secondary | ICD-10-CM | POA: Diagnosis not present

## 2019-06-02 DIAGNOSIS — Z8673 Personal history of transient ischemic attack (TIA), and cerebral infarction without residual deficits: Secondary | ICD-10-CM | POA: Diagnosis not present

## 2019-06-02 DIAGNOSIS — G459 Transient cerebral ischemic attack, unspecified: Secondary | ICD-10-CM | POA: Diagnosis not present

## 2019-06-06 DIAGNOSIS — G459 Transient cerebral ischemic attack, unspecified: Secondary | ICD-10-CM | POA: Diagnosis not present

## 2019-06-06 DIAGNOSIS — K219 Gastro-esophageal reflux disease without esophagitis: Secondary | ICD-10-CM | POA: Diagnosis not present

## 2019-07-04 ENCOUNTER — Other Ambulatory Visit: Payer: Self-pay | Admitting: Family Medicine

## 2019-07-04 DIAGNOSIS — M8580 Other specified disorders of bone density and structure, unspecified site: Secondary | ICD-10-CM | POA: Diagnosis not present

## 2019-07-04 DIAGNOSIS — G459 Transient cerebral ischemic attack, unspecified: Secondary | ICD-10-CM | POA: Diagnosis not present

## 2019-07-04 DIAGNOSIS — M19049 Primary osteoarthritis, unspecified hand: Secondary | ICD-10-CM | POA: Diagnosis not present

## 2019-07-04 DIAGNOSIS — E785 Hyperlipidemia, unspecified: Secondary | ICD-10-CM | POA: Diagnosis not present

## 2019-07-04 DIAGNOSIS — M21619 Bunion of unspecified foot: Secondary | ICD-10-CM | POA: Diagnosis not present

## 2019-07-04 DIAGNOSIS — K219 Gastro-esophageal reflux disease without esophagitis: Secondary | ICD-10-CM | POA: Diagnosis not present

## 2019-07-04 DIAGNOSIS — Z6823 Body mass index (BMI) 23.0-23.9, adult: Secondary | ICD-10-CM | POA: Diagnosis not present

## 2019-07-13 ENCOUNTER — Other Ambulatory Visit: Payer: Self-pay | Admitting: Family Medicine

## 2019-09-01 DIAGNOSIS — R7303 Prediabetes: Secondary | ICD-10-CM | POA: Diagnosis not present

## 2019-09-01 DIAGNOSIS — K219 Gastro-esophageal reflux disease without esophagitis: Secondary | ICD-10-CM | POA: Diagnosis not present

## 2019-09-01 DIAGNOSIS — M8580 Other specified disorders of bone density and structure, unspecified site: Secondary | ICD-10-CM | POA: Diagnosis not present

## 2019-09-01 DIAGNOSIS — M19042 Primary osteoarthritis, left hand: Secondary | ICD-10-CM | POA: Diagnosis not present

## 2019-09-01 DIAGNOSIS — G459 Transient cerebral ischemic attack, unspecified: Secondary | ICD-10-CM | POA: Diagnosis not present

## 2019-09-01 DIAGNOSIS — E785 Hyperlipidemia, unspecified: Secondary | ICD-10-CM | POA: Diagnosis not present

## 2019-09-01 DIAGNOSIS — R413 Other amnesia: Secondary | ICD-10-CM | POA: Diagnosis not present

## 2019-09-01 DIAGNOSIS — M21619 Bunion of unspecified foot: Secondary | ICD-10-CM | POA: Diagnosis not present

## 2019-09-01 DIAGNOSIS — M19041 Primary osteoarthritis, right hand: Secondary | ICD-10-CM | POA: Diagnosis not present

## 2019-09-23 ENCOUNTER — Other Ambulatory Visit: Payer: Self-pay | Admitting: Family Medicine

## 2019-11-21 ENCOUNTER — Other Ambulatory Visit: Payer: Self-pay

## 2019-11-21 ENCOUNTER — Encounter: Payer: Self-pay | Admitting: Family Medicine

## 2019-11-21 ENCOUNTER — Ambulatory Visit (INDEPENDENT_AMBULATORY_CARE_PROVIDER_SITE_OTHER): Payer: Medicare PPO | Admitting: Family Medicine

## 2019-11-21 VITALS — BP 130/78 | HR 68 | Temp 98.3°F | Ht 60.0 in | Wt 129.0 lb

## 2019-11-21 DIAGNOSIS — Z8673 Personal history of transient ischemic attack (TIA), and cerebral infarction without residual deficits: Secondary | ICD-10-CM

## 2019-11-21 DIAGNOSIS — E785 Hyperlipidemia, unspecified: Secondary | ICD-10-CM | POA: Diagnosis not present

## 2019-11-21 DIAGNOSIS — R739 Hyperglycemia, unspecified: Secondary | ICD-10-CM

## 2019-11-21 NOTE — Progress Notes (Signed)
Established Patient Office Visit  Subjective:  Patient ID: Claire [name], female    DOB: 1950-10-20  Age: 69 y.o. MRN: [id number]  CC: No chief complaint on file.   HPI Claire [name] presents for medical follow-up.  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  Her chronic problems include history of GERD, osteopenia, mild hyperglycemia, hyperlipidemia.  She apparently had a questionable TIA on 05/23/2019.  She described 30 minutes of transient confusion.  Denied any slurred speech, facial weakness, or any focal extremity weakness.  She was seen in the hospital [place] and had work-up there which included carotid Dopplers which showed no significant ICA stenosis.   Echo (TTE) showed only some trivial valvular regurgitation but no significant abnormalities.  There was no evidence for arrhythmia on monitoring.  She had both MRI of the brain as well as CT angiogram of the brain which showed no major abnormalities.  She was started on baby aspirin 81 mg daily and also Lipitor was increased from 20 to 40 mg.  Multiple studies were available for review on "Care Everywhere"  Patient relates she had another similar episode in early April.  She has had no episodes whatsoever since then.  She denied having any slurred speech, obvious facial weakness, transient visual changes, or any focal weakness.  Early current medications are Protonix 40 mg daily, aspirin 81 mg daily, atorvastatin 40 mg daily  Past Medical History:  Diagnosis Date  . Arthritis   . Cataracts, bilateral 2018   in November; dr. Tommy Rainwater  . GERD (gastroesophageal reflux disease)   . Hyperlipidemia   . Osteopenia   . Positive TB test     Past Surgical History:  Procedure Laterality Date  . BREAST BIOPSY Left    No noticable scar, Benign   . BREAST EXCISIONAL BIOPSY Left 1996  . [place]  . ESOPHAGOGASTRODUODENOSCOPY ENDOSCOPY  01/01/2018  . SHOULDER ARTHROSCOPY Left 2012   left  . WRIST FRACTURE SURGERY Right 2004   Right    Family History  Problem Relation Age of Onset  . Hyperlipidemia Father   . Lymphoma Brother   . Breast cancer Neg Hx     Social History   Socioeconomic History  . Marital status: Married    Spouse name: Not on file  . Number of children: Not on file  . Years of education: Not on file  . Highest education level: Not on file  Occupational History  . Not on file  Tobacco Use  . Smoking status: Never Smoker  . Smokeless tobacco: Never Used  Vaping Use  . Vaping Use: Never used  Substance and Sexual Activity  . Alcohol use: No  . Drug use: Not on file  . Sexual activity: Not on file  Other Topics Concern  . Not on file  Social History Narrative  . Not on file   Social Determinants of Health   Financial Resource Strain:   . Difficulty of Paying Living Expenses: Not on file  Food Insecurity:   . Worried About Charity fundraiser in the Last Year: Not on file  . Ran Out of Food in the Last Year: Not on file  Transportation Needs:   . Lack of Transportation (Medical): Not on file  . Lack of Transportation (Non-Medical): Not on file  Physical Activity:   .  Days of Exercise per Week: Not on file  . Minutes of Exercise per Session: Not on file  Stress:   . Feeling of Stress : Not on file  Social Connections:   . Frequency of Communication with Friends and Family: Not on file  . Frequency of Social Gatherings with Friends and Family: Not on file  . Attends Religious Services: Not on file  . Active Member of Clubs or Organizations: Not on file  . Attends Archivist Meetings: Not on file  . Marital Status: Not on file  Intimate Partner Violence:   . Fear of Current or Ex-Partner: Not on file  . Emotionally Abused: Not on file  . Physically Abused: Not on file  . Sexually Abused: Not on file    Outpatient  Medications Prior to Visit  Medication Sig Dispense Refill  . Ascorbic Acid (VITAMIN C PO) Take by mouth.    Marland Kitchen aspirin 81 MG tablet Take 81 mg by mouth every other day.     Marland Kitchen atorvastatin (LIPITOR) 20 MG tablet TAKE 1 TABLET BY MOUTH EVERY DAY (Patient taking differently: 40 mg. TAKE 1 TABLET BY MOUTH EVERY DAY) 90 tablet 0  . B Complex Vitamins (VITAMIN-B COMPLEX PO) Take by mouth.    . Calcium Carbonate (CALCIUM 600 PO) Take by mouth.    . calcium carbonate (TUMS - DOSED IN MG ELEMENTAL CALCIUM) 500 MG chewable tablet Chew 1 tablet by mouth daily. As needed    . famotidine (PEPCID) 20 MG tablet Take 20 mg by mouth daily. As needed    . Misc Natural Products (GLUCOSAMINE CHONDROITIN MSM) TABS Take by mouth.    . Multiple Vitamin (MULTIVITAMIN) tablet Take 1 tablet by mouth daily.    . pantoprazole (PROTONIX) 40 MG tablet TAKE 1 TABLET BY MOUTH EVERY DAY 90 tablet 0  . PREMARIN vaginal cream     . RESTASIS 0.05 % ophthalmic emulsion INT 1 GTT IN OU BID  0  . triamcinolone cream (KENALOG) 0.1 % Apply 1 application topically 2 (two) times daily as needed. 30 g 1  . doxycycline (VIBRAMYCIN) 100 MG capsule Take 1 capsule (100 mg total) by mouth 2 (two) times daily. 20 capsule 0   No facility-administered medications prior to visit.    Allergies  Allergen Reactions  . Penicillins     ROS Review of Systems  Constitutional: Negative for fatigue.  Eyes: Negative for visual disturbance.  Respiratory: Negative for cough, chest tightness, shortness of breath and wheezing.   Cardiovascular: Negative for chest pain, palpitations and leg swelling.  Endocrine: Negative for polydipsia and polyuria.  Neurological: Negative for dizziness, seizures, syncope, facial asymmetry, speech difficulty, weakness, light-headedness, numbness and headaches.  Psychiatric/Behavioral: Negative for confusion.      Objective:    Physical Exam Constitutional:      Appearance: She is well-developed.  Eyes:      Pupils: Pupils are equal, round, and reactive to light.  Neck:     Thyroid: No thyromegaly.     Vascular: No JVD.  Cardiovascular:     Rate and Rhythm: Normal rate and regular rhythm.     Heart sounds: No gallop.   Pulmonary:     Effort: Pulmonary effort is normal. No respiratory distress.     Breath sounds: Normal breath sounds. No wheezing or rales.  Musculoskeletal:     Cervical back: Neck supple.  Neurological:     General: No focal deficit present.     Mental Status: She is  alert and oriented to person, place, and time.     Cranial Nerves: No cranial nerve deficit.     Motor: No weakness.     Gait: Gait normal.     BP 130/78   Pulse 68   Temp 98.3 F (36.8 C) (Oral)   Ht 5' (1.524 m)   Wt 129 lb (58.5 kg)   SpO2 96%   BMI 25.19 kg/m  Wt Readings from Last 3 Encounters:  11/21/19 129 lb (58.5 kg)  02/16/19 124 lb 1.6 oz (56.3 kg)  11/09/18 123 lb 11.2 oz (56.1 kg)     Health Maintenance Due  Topic Date Due  . INFLUENZA VACCINE  10/30/2019    There are no preventive care reminders to display for this patient.  Lab Results  Component Value Date   TSH 3.09 11/09/2018   Lab Results  Component Value Date   WBC 4.6 02/16/2019   HGB 13.2 02/16/2019   HCT 39.8 02/16/2019   MCV 92.1 02/16/2019   PLT 280.0 02/16/2019   Lab Results  Component Value Date   NA 139 11/09/2018   K 4.4 11/09/2018   CO2 30 11/09/2018   GLUCOSE 98 11/09/2018   BUN 17 11/09/2018   CREATININE 0.73 11/09/2018   BILITOT 1.0 10/14/2017   ALKPHOS 52 10/28/2018   AST 25 10/28/2018   ALT 25 10/28/2018   PROT 6.8 10/14/2017   ALBUMIN 4.3 10/14/2017   CALCIUM 9.7 11/09/2018   GFR 78.84 11/09/2018   Lab Results  Component Value Date   CHOL 170 11/09/2018   Lab Results  Component Value Date   HDL 52.40 11/09/2018   Lab Results  Component Value Date   LDLCALC 87 11/09/2018   Lab Results  Component Value Date   TRIG 157.0 (H) 11/09/2018   Lab Results  Component Value Date    CHOLHDL 3 11/09/2018   Lab Results  Component Value Date   HGBA1C 5.9 02/16/2019      Assessment & Plan:   #1 history of 2 episodes of transient confusion with first episode occurring May 23, 2019 a second in April.  Question of TIA events.  She had fairly extensive work-up on outpatient basis in [place] as above.  Currently stable with no concerning symptoms  -Continue aspirin 81 mg daily -Continue Lipitor and check follow-up lipid panel with goal LDL less than 70 -Continue close blood pressure monitoring -Patient had questions regarding consulting with local neurologist and we will set up with stroke specialist  #2 history of mild hyperglycemia -Recheck hemoglobin A1c  No orders of the defined types were placed in this encounter.   Follow-up: No follow-ups on file.    Carolann Littler, MD

## 2019-11-21 NOTE — Patient Instructions (Signed)
Transient Ischemic Attack A transient ischemic attack (TIA) is a "warning stroke" that causes stroke-like symptoms that then go away quickly. The symptoms of a TIA come on suddenly, and they last less than 24 hours. Unlike a stroke, a TIA does not cause permanent damage to the brain. It is important to know the symptoms of a TIA and what to do. Seek medical care right away, even if your symptoms go away. Having a TIA is a sign that you are at higher risk for a permanent stroke. Lifestyle changes and medical treatments can help prevent a stroke. What are the causes? This condition is caused by a temporary blockage in an artery in the head or neck. The blockage does not allow the brain to get the blood supply it needs and can cause various symptoms. The blockage can be caused by:  Fatty buildup in an artery in the head or neck (atherosclerosis).  A blood clot.  Tearing of an artery (dissection).  Inflammation of an artery (vasculitis). Sometimes the cause is not known. What increases the risk? Certain factors may make you more likely to develop this condition. Some of these factors are things that you can change, such as:  Obesity.  Using products that contain nicotine or tobacco, such as cigarettes and e-cigarettes.  Taking oral birth control, especially if you also use tobacco.  Lack of physical activity.  Excessive use of alcohol.  Use of drugs, especially cocaine and methamphetamine. Other risk factors include:  High blood pressure (hypertension).  High cholesterol.  Diabetes mellitus.  Heart disease (coronary artery disease).  Atrial fibrillation.  Being over the age of 60.  Being female.  Family history of stroke.  Previous history of blood clots, stroke, TIA, or heart attack.  Sickle cell disease.  Being a woman with a history of preeclampsia.  Migraine headache.  Sleep apnea.  Chronic inflammatory diseases, such as rheumatoid arthritis or lupus.  Blood  clotting disorders (hypercoagulable state). What are the signs or symptoms? Symptoms of this condition are the same as those of a stroke, but they are temporary. The symptoms develop suddenly, and they go away quickly, usually within minutes to hours. Symptoms may include sudden:  Weakness or numbness in your face, arm, or leg, especially on one side of your body.  Trouble walking or difficulty moving your arms or legs.  Trouble speaking, understanding speech, or both (aphasia).  Vision changes in one or both eyes. These include double vision, blurred vision, or loss of vision.  Dizziness.  Confusion.  Loss of balance or coordination.  Nausea and vomiting.  Severe headache with no known cause. If possible, make note of the exact time that you last felt like your normal self and what time your symptoms started. Tell your health care provider. How is this diagnosed? This condition may be diagnosed based on:  Your symptoms and medical history.  A physical exam.  Imaging tests, usually a CT or MRI scan of the brain.  Blood tests. You may also have other tests, including:  Electrocardiogram (ECG).  Echocardiogram.  Carotid ultrasound.  A scan of the brain circulation (CT angiogram or MRI angiogram).  Continuous heart monitoring. How is this treated? The goal of treatment is to reduce the risk for a subsequent stroke. Treatment may include stroke prevention therapies such as:  Changes to diet or lifestyle to decrease your risk. Lifestyle changes may include exercising and stopping smoking.  Medicines to thin the blood (antiplatelets or anticoagulants).  Blood pressure medicines.    Medicines to reduce cholesterol.  Treating other health conditions, such as diabetes or atrial fibrillation. If testing shows that you have narrowing in the arteries to your brain, your health care provider may recommend a procedure, such as:  Carotid endarterectomy. This is a surgery to  remove the blockage from your artery.  Carotid angioplasty and stenting. This is a procedure to open or widen an artery in the neck using a metal mesh tube (stent). The stent helps keep the artery open by supporting the artery walls. Follow these instructions at home: Medicines  Take over-the-counter and prescription medicines only as told by your health care provider.  If you were told to take a medicine to thin your blood, such as aspirin or an anticoagulant, take it exactly as told by your health care provider. ? Taking too much blood-thinning medicine can cause bleeding. ? If you do not take enough blood-thinning medicine, you will not have the protection that you need against a stroke and other problems. Eating and drinking   Eat 5 or more servings of fruits and vegetables each day.  Follow instructions from your health care provider about diet. You may need to follow a certain nutrition plan to help manage risk factors for stroke, such as high blood pressure, high cholesterol, diabetes, or obesity. This may include: ? Eating a low-fat, low-salt diet. ? Including a lot of fiber in your diet. ? Limiting the amount of carbohydrates and sugar in your diet.  Limit alcohol intake to no more than 1 drink a day for nonpregnant women and 2 drinks a day for men. One drink equals 12 oz of beer, 5 oz of wine, or 1 oz of hard liquor. General instructions  Maintain a healthy weight.  Stay physically active. Try to get at least 30 minutes of exercise on most or all days.  Find out if you have sleep apnea, and seek treatment if needed.  Do not use any products that contain nicotine or tobacco, such as cigarettes and e-cigarettes. If you need help quitting, ask your health care provider.  Do not abuse drugs.  Keep all follow-up visits as told by your health care provider. This is important. Where to find more information  American Stroke Association: www.strokeassociation.org  National  Stroke Association: www.stroke.org Get help right away if:  You have chest pain or an irregular heartbeat.  You have any symptoms of stroke. The acronym BEFAST is an easy way to remember the main warning signs of stroke. ? B - Balance problems. Signs include dizziness, sudden trouble walking, or loss of balance. ? E - Eye problems. This includes trouble seeing or a sudden change in vision. ? F - Face changes. This includes sudden weakness or numbness of the face, or the face or eyelid drooping to one side. ? A - Arm weakness or numbness. This happens suddenly and usually on one side of the body. ? S - Speech problems. This includes trouble speaking or trouble understanding speech. ? T - Time. Time to call 911 or seek emergency care. Do not wait to see if symptoms will go away. Make note of the time your symptoms started.  Other signs of stroke may include: ? A sudden, severe headache with no known cause. ? Nausea or vomiting. ? Seizure. These symptoms may represent a serious problem that is an emergency. Do not wait to see if the symptoms will go away. Get medical help right away. Call your local emergency services (911 in the [place] Do   not drive yourself to the hospital. Summary  A TIA happens when an artery in the head or neck is blocked, leading to stroke-like symptoms that then go away quickly. The blockage clears before there is any permanent brain damage. A TIA is a medical emergency and requires immediate medical attention.  Symptoms of this condition are the same as those of a stroke, but they are temporary. The symptoms usually develop suddenly, and they go away quickly, usually within minutes to hours.  Having a TIA means that you are at high risk of a stroke in the near future.  Treatment may include medicines to thin the blood as well as medicines, diet changes, and lifestyle changes to manage conditions that increase the risk of another TIA or a stroke. This information is not  intended to replace advice given to you by your health care provider. Make sure you discuss any questions you have with your health care provider. Document Revised: 09/24/2018 Document Reviewed: 09/24/2018 Elsevier Patient Education  2020 [employer]  

## 2019-11-22 DIAGNOSIS — H16223 Keratoconjunctivitis sicca, not specified as Sjogren's, bilateral: Secondary | ICD-10-CM | POA: Diagnosis not present

## 2019-11-22 DIAGNOSIS — H40013 Open angle with borderline findings, low risk, bilateral: Secondary | ICD-10-CM | POA: Diagnosis not present

## 2019-11-22 DIAGNOSIS — H18413 Arcus senilis, bilateral: Secondary | ICD-10-CM | POA: Diagnosis not present

## 2019-11-22 DIAGNOSIS — Z961 Presence of intraocular lens: Secondary | ICD-10-CM | POA: Diagnosis not present

## 2019-11-22 LAB — BASIC METABOLIC PANEL
BUN: 12 mg/dL (ref 7–25)
CO2: 29 mmol/L (ref 20–32)
Calcium: 9.5 mg/dL (ref 8.6–10.4)
Chloride: 105 mmol/L (ref 98–110)
Creat: 0.77 mg/dL (ref 0.60–0.93)
Glucose, Bld: 94 mg/dL (ref 65–99)
Potassium: 4.5 mmol/L (ref 3.5–5.3)
Sodium: 142 mmol/L (ref 135–146)

## 2019-11-22 LAB — LIPID PANEL
Cholesterol: 167 mg/dL (ref <200)
HDL: 54 mg/dL (ref >=50)
LDL Cholesterol (Calc): 88 mg/dL (calc)
Non-HDL Cholesterol (Calc): 113 mg/dL (calc) (ref <130)
Total CHOL/HDL Ratio: 3.1 (calc) (ref <5.0)
Triglycerides: 148 mg/dL (ref <150)

## 2019-11-22 LAB — HEMOGLOBIN A1C
Hgb A1c MFr Bld: 5.8 % of total Hgb — ABNORMAL HIGH (ref <5.7)
Mean Plasma Glucose: 120 (calc)
eAG (mmol/L): 6.6 (calc)

## 2019-11-23 DIAGNOSIS — Z01419 Encounter for gynecological examination (general) (routine) without abnormal findings: Secondary | ICD-10-CM | POA: Diagnosis not present

## 2019-11-29 DIAGNOSIS — M25571 Pain in right ankle and joints of right foot: Secondary | ICD-10-CM | POA: Diagnosis not present

## 2019-11-29 DIAGNOSIS — M25572 Pain in left ankle and joints of left foot: Secondary | ICD-10-CM | POA: Diagnosis not present

## 2019-11-30 ENCOUNTER — Encounter: Payer: Self-pay | Admitting: Neurology

## 2019-12-02 ENCOUNTER — Encounter: Payer: Medicare Other | Admitting: Family Medicine

## 2019-12-02 ENCOUNTER — Ambulatory Visit: Payer: Medicare Other

## 2019-12-06 ENCOUNTER — Other Ambulatory Visit: Payer: Self-pay

## 2019-12-06 DIAGNOSIS — R202 Paresthesia of skin: Secondary | ICD-10-CM

## 2019-12-07 ENCOUNTER — Ambulatory Visit: Payer: Medicare PPO | Admitting: Neurology

## 2019-12-07 DIAGNOSIS — R202 Paresthesia of skin: Secondary | ICD-10-CM | POA: Diagnosis not present

## 2019-12-07 NOTE — Procedures (Signed)
[facility]  [address]  [place], [place] [address] Tel: [phone] Fax:  [phone] Test Date:  12/07/2019  Patient: Claire [name] DOB: 1950/09/22 Physician: Narda Amber, DO  Sex: Female Height: 5' " Ref Phys: Brett Albino  ID#: [id number] Temp: 33.0C Technician:    Patient Complaints: This is a 69 year old female referred for evaluation of left foot numbness.  NCV & EMG Findings: Extensive electrodiagnostic testing of the left lower extremity and additional studies of the right shows:  1. Bilateral sural and superficial peroneal sensory responses are within normal limits. 2. Bilateral peroneal motor responses show reduced amplitude at the extensor digitorum brevis (L0.8, R2.0 mV), and is normal at the tibialis anterior.  In isolation, these findings are of unclear clinical significance and often related to localized compression from shoe wearing.   3. Bilateral tibial H reflex duties are within normal limits. 4. There is no evidence of active or chronic motor axonal loss changes affecting any of the tested muscles.  Motor unit configuration and recruitment pattern is within normal limits.   Impression: This is a normal study of the lower extremities.  In particular, there is no evidence of a sensorimotor polyneuropathy or lumbosacral radiculopathy.   ___________________________ Narda Amber, DO    Nerve Conduction Studies Anti Sensory Summary Table   Stim Site NR Peak (ms) Norm Peak (ms) P-T Amp (V) Norm P-T Amp  Left Sup Peroneal Anti Sensory (Ant Lat Mall)  33C  12 cm    2.4 <4.6 7.6 >3  Right Sup Peroneal Anti Sensory (Ant Lat Mall)  33C  12 cm    2.6 <4.6 7.2 >3  Left Sural Anti Sensory (Lat Mall)  33C  Calf    3.0 <4.6 8.3 >3  Right Sural Anti Sensory (Lat Mall)  33C  Calf    2.8 <4.6 7.8 >3   Motor Summary Table   Stim Site NR Onset (ms) Norm Onset (ms) O-P Amp (mV) Norm O-P Amp Site1 Site2 Delta-0 (ms) Dist (cm) Vel (m/s)  Norm Vel (m/s)  Left Peroneal Motor (Ext Dig Brev)  33C  Ankle    4.6 <6.0 0.8 >2.5 B Fib Ankle 8.0 34.0 43 >40  B Fib    12.6  0.6  Poplt B Fib 1.6 7.0 44 >40  Poplt    14.2  0.5         Right Peroneal Motor (Ext Dig Brev)  33C  Ankle    4.2 <6.0 2.0 >2.5 B Fib Ankle 7.4 34.0 46 >40  B Fib    11.6  1.9  Poplt B Fib 1.7 7.0 41 >40  Poplt    13.3  1.5         Left Peroneal TA Motor (Tib Ant)  33C  Fib Head    2.4 <4.5 4.5 >3 Poplit Fib Head 1.3 7.0 54 >40  Poplit    3.7  4.3         Right Peroneal TA Motor (Tib Ant)  33C  Fib Head    2.7 <4.5 5.3 >3 Poplit Fib Head 1.2 7.0 58 >40  Poplit    3.9  5.0         Left Tibial Motor (Abd Hall Brev)  33C  Ankle    3.4 <6.0 5.1 >4 Knee Ankle 8.3 36.0 43 >40  Knee    11.7  3.1         Right Tibial Motor (Abd Hall Brev)  33C  Ankle  3.4 <6.0 4.7 >4 Knee Ankle 7.7 37.0 48 >40  Knee    11.1  2.0          H Reflex Studies   NR H-Lat (ms) Lat Norm (ms) L-R H-Lat (ms)  Left Tibial (Gastroc)  33C     30.48 <35 1.36  Right Tibial (Gastroc)  33C     31.84 <35 1.36   EMG   Side Muscle Ins Act Fibs Psw Fasc Number Recrt Dur Dur. Amp Amp. Poly Poly. Comment  Left AntTibialis Nml Nml Nml Nml Nml Nml Nml Nml Nml Nml Nml Nml N/A  Left Gastroc Nml Nml Nml Nml Nml Nml Nml Nml Nml Nml Nml Nml N/A  Left Flex Dig Long Nml Nml Nml Nml Nml Nml Nml Nml Nml Nml Nml Nml N/A  Left RectFemoris Nml Nml Nml Nml Nml Nml Nml Nml Nml Nml Nml Nml N/A  Left BicepsFemS Nml Nml Nml Nml Nml Nml Nml Nml Nml Nml Nml Nml N/A  Right BicepsFemS Nml Nml Nml Nml Nml Nml Nml Nml Nml Nml Nml Nml N/A  Right AntTibialis Nml Nml Nml Nml Nml Nml Nml Nml Nml Nml Nml Nml N/A  Right Gastroc Nml Nml Nml Nml Nml Nml Nml Nml Nml Nml Nml Nml N/A  Right Flex Dig Long Nml Nml Nml Nml Nml Nml Nml Nml Nml Nml Nml Nml N/A  Right RectFemoris Nml Nml Nml Nml Nml Nml Nml Nml Nml Nml Nml Nml N/A      Waveforms:

## 2019-12-21 ENCOUNTER — Other Ambulatory Visit: Payer: Self-pay | Admitting: Family Medicine

## 2019-12-27 ENCOUNTER — Encounter: Payer: Self-pay | Admitting: Diagnostic Neuroimaging

## 2019-12-27 ENCOUNTER — Ambulatory Visit: Payer: Medicare PPO | Admitting: Diagnostic Neuroimaging

## 2019-12-27 VITALS — BP 143/78 | HR 63 | Ht 61.0 in | Wt 131.2 lb

## 2019-12-27 DIAGNOSIS — G454 Transient global amnesia: Secondary | ICD-10-CM

## 2019-12-27 DIAGNOSIS — G43109 Migraine with aura, not intractable, without status migrainosus: Secondary | ICD-10-CM

## 2019-12-27 NOTE — Patient Instructions (Signed)
TRANSIENT CONFUSION / AMNESIA / HEADACHE (transient global amnesia vs complicated migraine)  - monitor symptoms; optimize nutrition, sleep, hydration, stress mgmt  - consider migraine treatments if HA continue

## 2019-12-27 NOTE — Progress Notes (Signed)
[facility]  PATIENT: Claire [name] DOB: [birth year]  REFERRING CLINICIAN: Burchette, Alinda Sierras, MD HISTORY FROM: patient  REASON FOR VISIT: new consult    HISTORICAL  CHIEF COMPLAINT:  Chief Complaint  Patient presents with  . Hx of TIAs    rm 7 "TIA in [place] in Feb, maybe another one in April, there's no neurologist in [place],  here visiting"    HISTORY OF PRESENT ILLNESS:   69 year old female here for evaluation of transient confusion spell.  05/23/2019 patient had 30-minute episode of transient memory loss, confusion, fuzzy headed feeling, headache and nausea.  Patient went to local emergency room in [place] [facility]) and was evaluated.  She had a CTA of the head and neck which were unremarkable.  Several days later she had echocardiogram and MRI of the brain which were unremarkable.  Patient returned to the hospital several days later due to shortness of breath, had a evaluation and was discharged home.  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  She retired 5 to 10 years ago.  She moved to [place] in December 2020 to be closer to her son.  She was under increased stress and having less sleep during that time.  This stress was quite significant in the 1 month leading up to her episode in February 2021.  Since that time patient is having some occasional intermittent headaches.  Headaches occur once a week, with pressure and throbbing sensation deep in the middle of her head, back of her head and neck.  No photophobia or phonophobia.  No nausea or vomiting.  No prior similar headaches.   REVIEW OF SYSTEMS: Full 14 system review of systems performed and negative with exception of: As per HPI.  ALLERGIES: Allergies  Allergen Reactions  . Penicillins Anaphylaxis    HOME MEDICATIONS: Outpatient Medications Prior to Visit  Medication Sig Dispense Refill  . Ascorbic Acid (VITAMIN C PO) Take by  mouth.    Marland Kitchen aspirin 81 MG tablet Take 81 mg by mouth every other day.     Marland Kitchen atorvastatin (LIPITOR) 40 MG tablet Take 1 tablet by mouth daily.    . B Complex Vitamins (VITAMIN-B COMPLEX PO) Take by mouth.    . Calcium Carbonate (CALCIUM 600 PO) Take by mouth.    . calcium carbonate (TUMS - DOSED IN MG ELEMENTAL CALCIUM) 500 MG chewable tablet Chew 1 tablet by mouth daily. As needed    . famotidine (PEPCID) 20 MG tablet Take 20 mg by mouth daily. As needed    . Misc Natural Products (GLUCOSAMINE CHONDROITIN MSM) TABS Take by mouth.    . Multiple Vitamin (MULTIVITAMIN) tablet Take 1 tablet by mouth daily.    . pantoprazole (PROTONIX) 40 MG tablet TAKE 1 TABLET BY MOUTH EVERY DAY 90 tablet 0  . PREMARIN vaginal cream     . RESTASIS 0.05 % ophthalmic emulsion INT 1 GTT IN OU BID  0  . triamcinolone cream (KENALOG) 0.1 % Apply 1 application topically 2 (two) times daily as needed. 30 g 1  . atorvastatin (LIPITOR) 20 MG tablet TAKE 1 TABLET BY MOUTH EVERY DAY (Patient taking differently: 40 mg. TAKE 1 TABLET BY MOUTH EVERY DAY) 90 tablet 0   No facility-administered medications prior to visit.    PAST MEDICAL HISTORY: Past Medical History:  Diagnosis Date  . Arthritis   . Cataracts, bilateral 2018   in November; dr. Tommy [name]  .  GERD (gastroesophageal reflux disease)   . History of TIA (transient ischemic attack) 05/2019  . Hyperlipidemia   . Osteopenia   . Positive TB test     PAST SURGICAL HISTORY: Past Surgical History:  Procedure Laterality Date  . BREAST BIOPSY Left    No noticable scar, Benign   . BREAST EXCISIONAL BIOPSY Left 1996  . [employer]  . ESOPHAGOGASTRODUODENOSCOPY ENDOSCOPY  01/01/2018  . SHOULDER ARTHROSCOPY Left 2012   left  . WRIST FRACTURE SURGERY Right 2004   Right    FAMILY HISTORY: Family History  Problem Relation Age of Onset  . Hyperlipidemia Father   . Lymphoma Brother   . Breast cancer Neg Hx     SOCIAL HISTORY: Social History    Socioeconomic History  . Marital status: Married    Spouse name: Claire [name]  . Number of children: 3  . Years of education: Not on file  . Highest education level: Not on file  Occupational History    Comment: retired  Tobacco Use  . Smoking status: Never Smoker  . Smokeless tobacco: Never Used  Vaping Use  . Vaping Use: Never used  Substance and Sexual Activity  . Alcohol use: No  . Drug use: Never  . Sexual activity: Not on file  Other Topics Concern  . Not on file  Social History Narrative   Lives with husband   Caffeine- coffee 1 c daily   Social Determinants of Health   Financial Resource Strain:   . Difficulty of Paying Living Expenses: Not on file  Food Insecurity:   . Worried About Charity fundraiser in the Last Year: Not on file  . Ran Out of Food in the Last Year: Not on file  Transportation Needs:   . Lack of Transportation (Medical): Not on file  . Lack of Transportation (Non-Medical): Not on file  Physical Activity:   . Days of Exercise per Week: Not on file  . Minutes of Exercise per Session: Not on file  Stress:   . Feeling of Stress : Not on file  Social Connections:   . Frequency of Communication with Friends and Family: Not on file  . Frequency of Social Gatherings with Friends and Family: Not on file  . Attends Religious Services: Not on file  . Active Member of Clubs or Organizations: Not on file  . Attends Archivist Meetings: Not on file  . Marital Status: Not on file  Intimate Partner Violence:   . Fear of Current or Ex-Partner: Not on file  . Emotionally Abused: Not on file  . Physically Abused: Not on file  . Sexually Abused: Not on file     PHYSICAL EXAM  GENERAL EXAM/CONSTITUTIONAL: Vitals:  Vitals:   12/27/19 1356  BP: (!) 143/78  Pulse: 63  Weight: 131 lb 3.2 oz (59.5 kg)  Height: 5\' 1"  (1.549 m)     Body mass index is 24.79 kg/m. Wt Readings from Last 3 Encounters:  12/27/19 131 lb 3.2 oz (59.5 kg)  11/21/19  129 lb (58.5 kg)  02/16/19 124 lb 1.6 oz (56.3 kg)     Patient is in no distress; well developed, nourished and groomed; neck is supple  CARDIOVASCULAR:  Examination of carotid arteries is normal; no carotid bruits  Regular rate and rhythm, no murmurs  Examination of peripheral vascular system by observation and palpation is normal  EYES:  Ophthalmoscopic exam of optic discs and posterior segments is normal; no papilledema or hemorrhages  No exam data present  MUSCULOSKELETAL:  Gait, strength, tone, movements noted in Neurologic exam below  NEUROLOGIC: MENTAL STATUS:  MMSE - Mini Mental State Exam 10/28/2017  Not completed: (No Data)    awake, alert, oriented to person, place and time  recent and remote memory intact  normal attention and concentration  language fluent, comprehension intact, naming intact  fund of knowledge appropriate  CRANIAL NERVE:   2nd - no papilledema on fundoscopic exam  2nd, 3rd, 4th, 6th - pupils equal and reactive to light, visual fields full to confrontation, extraocular muscles intact, no nystagmus  5th - facial sensation symmetric  7th - facial strength symmetric  8th - hearing intact  9th - palate elevates symmetrically, uvula midline  11th - shoulder shrug symmetric  12th - tongue protrusion midline  MOTOR:   normal bulk and tone, full strength in the BUE, BLE  SENSORY:   normal and symmetric to light touch, pinprick, temperature, vibration  COORDINATION:   finger-nose-finger, fine finger movements normal  REFLEXES:   deep tendon reflexes present and symmetric  GAIT/STATION:   narrow based gait     DIAGNOSTIC DATA (LABS, IMAGING, TESTING) - I reviewed patient records, labs, notes, testing and imaging myself where available.  Lab Results  Component Value Date   WBC 4.6 02/16/2019   HGB 13.2 02/16/2019   HCT 39.8 02/16/2019   MCV 92.1 02/16/2019   PLT 280.0 02/16/2019      Component Value  Date/Time   NA 142 11/21/2019 1100   NA 141 10/28/2018 0000   K 4.5 11/21/2019 1100   CL 105 11/21/2019 1100   CO2 29 11/21/2019 1100   GLUCOSE 94 11/21/2019 1100   BUN 12 11/21/2019 1100   BUN 13 10/28/2018 0000   CREATININE 0.77 11/21/2019 1100   CALCIUM 9.5 11/21/2019 1100   PROT 6.8 10/14/2017 0942   ALBUMIN 4.3 10/14/2017 0942   AST 25 10/28/2018 0000   ALT 25 10/28/2018 0000   ALKPHOS 52 10/28/2018 0000   BILITOT 1.0 10/14/2017 0942   Lab Results  Component Value Date   CHOL 167 11/21/2019   HDL 54 11/21/2019   LDLCALC 88 11/21/2019   TRIG 148 11/21/2019   CHOLHDL 3.1 11/21/2019   Lab Results  Component Value Date   HGBA1C 5.8 (H) 11/21/2019   No results found for: VITAMINB12 Lab Results  Component Value Date   TSH 3.09 11/09/2018    06/02/2019 echocardiogram  1. Normal left ventricular size and systolic function, with EF 55-60%.  2. Normal right ventricular size and systolic function.  3. Trace aortic regurgitation.  4. No evidence of interatrial shunting by agitated saline, at rest or with  maneuvers.   06/02/2019 MRI brain - NO ACUTE INTRACRANIAL ABNORMALITY.  05/23/19 CTA head / neck:  1. NO PROXIMAL INTRACRANIAL ARTERIAL OCCLUSION OR SIGNIFICANT INTRACRANIAL ARTERIAL STENOSIS.  2. NO SIGNIFICANT STENOSIS OF THE CERVICAL INTERNAL CAROTID AND VERTEBRAL ARTERIES.     ASSESSMENT AND PLAN  69 y.o. year old female here with 30-minute episode of transient confusion, memory lapse, headache, nausea, on 05/23/2019, may represent transient global amnesia.  Patient had some migraine headache features at the time and therefore could represent a complicated migraine.  Patient continues to have some intermittent headaches.  Extensive stroke/TIA work-up was completed and unremarkable.  Dx:  1. Transient global amnesia   2. Complicated migraine     PLAN:  TRANSIENT CONFUSION / AMNESIA / HEADACHE (transient global amnesia vs complicated migraine) - monitor  symptoms; optimize nutrition, sleep, hydration, stress mgmt - consider migraine treatments if HA continue - TIA less likely; continue aspirin, statin; monitor BP  Return for return to PCP, pending if symptoms worsen or fail to improve.    Penni Bombard, MD 2/58/5277, 8:24 PM Certified in Neurology, Neurophysiology and Neuroimaging  [facility] [address] [place], [place] [address] [phone]

## 2019-12-28 ENCOUNTER — Encounter: Payer: Medicare PPO | Admitting: Neurology

## 2020-03-19 DIAGNOSIS — M8589 Other specified disorders of bone density and structure, multiple sites: Secondary | ICD-10-CM | POA: Diagnosis not present

## 2020-03-19 DIAGNOSIS — Z1231 Encounter for screening mammogram for malignant neoplasm of breast: Secondary | ICD-10-CM | POA: Diagnosis not present

## 2020-07-23 DIAGNOSIS — R509 Fever, unspecified: Secondary | ICD-10-CM | POA: Diagnosis not present

## 2020-07-23 DIAGNOSIS — G4483 Primary cough headache: Secondary | ICD-10-CM | POA: Diagnosis not present

## 2020-07-28 DIAGNOSIS — Z20822 Contact with and (suspected) exposure to covid-19: Secondary | ICD-10-CM | POA: Diagnosis not present

## 2020-07-28 DIAGNOSIS — U071 COVID-19: Secondary | ICD-10-CM | POA: Diagnosis not present

## 2020-08-21 DIAGNOSIS — M19049 Primary osteoarthritis, unspecified hand: Secondary | ICD-10-CM | POA: Diagnosis not present

## 2020-08-21 DIAGNOSIS — E785 Hyperlipidemia, unspecified: Secondary | ICD-10-CM | POA: Diagnosis not present

## 2020-08-21 DIAGNOSIS — G459 Transient cerebral ischemic attack, unspecified: Secondary | ICD-10-CM | POA: Diagnosis not present

## 2020-08-21 DIAGNOSIS — K219 Gastro-esophageal reflux disease without esophagitis: Secondary | ICD-10-CM | POA: Diagnosis not present

## 2020-08-21 DIAGNOSIS — M21619 Bunion of unspecified foot: Secondary | ICD-10-CM | POA: Diagnosis not present

## 2020-08-21 DIAGNOSIS — M8580 Other specified disorders of bone density and structure, unspecified site: Secondary | ICD-10-CM | POA: Diagnosis not present

## 2020-08-21 DIAGNOSIS — R413 Other amnesia: Secondary | ICD-10-CM | POA: Diagnosis not present

## 2020-08-21 DIAGNOSIS — I351 Nonrheumatic aortic (valve) insufficiency: Secondary | ICD-10-CM | POA: Diagnosis not present

## 2020-08-21 DIAGNOSIS — R7303 Prediabetes: Secondary | ICD-10-CM | POA: Diagnosis not present

## 2020-08-28 DIAGNOSIS — R7303 Prediabetes: Secondary | ICD-10-CM | POA: Diagnosis not present

## 2020-08-28 DIAGNOSIS — I351 Nonrheumatic aortic (valve) insufficiency: Secondary | ICD-10-CM | POA: Diagnosis not present

## 2020-08-28 DIAGNOSIS — M8580 Other specified disorders of bone density and structure, unspecified site: Secondary | ICD-10-CM | POA: Diagnosis not present

## 2020-08-28 DIAGNOSIS — M21619 Bunion of unspecified foot: Secondary | ICD-10-CM | POA: Diagnosis not present

## 2020-08-28 DIAGNOSIS — G454 Transient global amnesia: Secondary | ICD-10-CM | POA: Diagnosis not present

## 2020-08-28 DIAGNOSIS — K219 Gastro-esophageal reflux disease without esophagitis: Secondary | ICD-10-CM | POA: Diagnosis not present

## 2020-08-28 DIAGNOSIS — R252 Cramp and spasm: Secondary | ICD-10-CM | POA: Diagnosis not present

## 2020-08-28 DIAGNOSIS — E785 Hyperlipidemia, unspecified: Secondary | ICD-10-CM | POA: Diagnosis not present

## 2020-09-06 ENCOUNTER — Telehealth: Payer: Self-pay | Admitting: Family Medicine

## 2020-09-06 NOTE — Telephone Encounter (Signed)
Left message for patient to call back and schedule Medicare Annual Wellness Visit (AWV) either virtually or in office.   Last AWV 10/28/17  please schedule at anytime with [facility] Nurse Health Advisor 1 or 2   This should be a 45 minute visit.

## 2020-10-19 DIAGNOSIS — U071 COVID-19: Secondary | ICD-10-CM | POA: Diagnosis not present

## 2020-10-19 DIAGNOSIS — R051 Acute cough: Secondary | ICD-10-CM | POA: Diagnosis not present

## 2020-10-19 DIAGNOSIS — J069 Acute upper respiratory infection, unspecified: Secondary | ICD-10-CM | POA: Diagnosis not present

## 2020-10-19 DIAGNOSIS — J101 Influenza due to other identified influenza virus with other respiratory manifestations: Secondary | ICD-10-CM | POA: Diagnosis not present

## 2020-11-05 DIAGNOSIS — H43813 Vitreous degeneration, bilateral: Secondary | ICD-10-CM | POA: Diagnosis not present

## 2020-11-05 DIAGNOSIS — H35371 Puckering of macula, right eye: Secondary | ICD-10-CM | POA: Diagnosis not present

## 2020-11-05 DIAGNOSIS — H01026 Squamous blepharitis left eye, unspecified eyelid: Secondary | ICD-10-CM | POA: Diagnosis not present

## 2020-11-05 DIAGNOSIS — H01003 Unspecified blepharitis right eye, unspecified eyelid: Secondary | ICD-10-CM | POA: Diagnosis not present

## 2020-11-05 DIAGNOSIS — H01023 Squamous blepharitis right eye, unspecified eyelid: Secondary | ICD-10-CM | POA: Diagnosis not present

## 2020-11-05 DIAGNOSIS — H26492 Other secondary cataract, left eye: Secondary | ICD-10-CM | POA: Diagnosis not present

## 2020-11-05 DIAGNOSIS — T1511XA Foreign body in conjunctival sac, right eye, initial encounter: Secondary | ICD-10-CM | POA: Diagnosis not present

## 2020-11-05 DIAGNOSIS — H35363 Drusen (degenerative) of macula, bilateral: Secondary | ICD-10-CM | POA: Diagnosis not present

## 2020-12-04 ENCOUNTER — Ambulatory Visit: Payer: Medicare PPO

## 2020-12-04 ENCOUNTER — Ambulatory Visit: Payer: Medicare PPO | Admitting: Family Medicine

## 2020-12-05 ENCOUNTER — Ambulatory Visit: Payer: Medicare PPO | Admitting: Family Medicine

## 2020-12-05 ENCOUNTER — Ambulatory Visit: Payer: Medicare PPO

## 2020-12-16 DIAGNOSIS — Z23 Encounter for immunization: Secondary | ICD-10-CM | POA: Diagnosis not present

## 2021-03-18 DIAGNOSIS — L723 Sebaceous cyst: Secondary | ICD-10-CM | POA: Diagnosis not present

## 2021-03-18 DIAGNOSIS — D171 Benign lipomatous neoplasm of skin and subcutaneous tissue of trunk: Secondary | ICD-10-CM | POA: Diagnosis not present

## 2021-03-18 DIAGNOSIS — L810 Postinflammatory hyperpigmentation: Secondary | ICD-10-CM | POA: Diagnosis not present

## 2021-03-18 DIAGNOSIS — D4989 Neoplasm of unspecified behavior of other specified sites: Secondary | ICD-10-CM | POA: Diagnosis not present

## 2021-03-18 DIAGNOSIS — D225 Melanocytic nevi of trunk: Secondary | ICD-10-CM | POA: Diagnosis not present

## 2021-03-18 DIAGNOSIS — B079 Viral wart, unspecified: Secondary | ICD-10-CM | POA: Diagnosis not present

## 2021-04-30 DIAGNOSIS — E785 Hyperlipidemia, unspecified: Secondary | ICD-10-CM | POA: Diagnosis not present

## 2021-04-30 DIAGNOSIS — R7303 Prediabetes: Secondary | ICD-10-CM | POA: Diagnosis not present

## 2021-04-30 DIAGNOSIS — I351 Nonrheumatic aortic (valve) insufficiency: Secondary | ICD-10-CM | POA: Diagnosis not present

## 2021-04-30 DIAGNOSIS — M21612 Bunion of left foot: Secondary | ICD-10-CM | POA: Diagnosis not present

## 2021-04-30 DIAGNOSIS — M8580 Other specified disorders of bone density and structure, unspecified site: Secondary | ICD-10-CM | POA: Diagnosis not present

## 2021-04-30 DIAGNOSIS — M19049 Primary osteoarthritis, unspecified hand: Secondary | ICD-10-CM | POA: Diagnosis not present

## 2021-04-30 DIAGNOSIS — M21611 Bunion of right foot: Secondary | ICD-10-CM | POA: Diagnosis not present

## 2021-04-30 DIAGNOSIS — K219 Gastro-esophageal reflux disease without esophagitis: Secondary | ICD-10-CM | POA: Diagnosis not present

## 2021-05-08 DIAGNOSIS — E7849 Other hyperlipidemia: Secondary | ICD-10-CM | POA: Diagnosis not present

## 2021-05-08 DIAGNOSIS — Z1211 Encounter for screening for malignant neoplasm of colon: Secondary | ICD-10-CM | POA: Diagnosis not present

## 2021-05-08 DIAGNOSIS — R7309 Other abnormal glucose: Secondary | ICD-10-CM | POA: Diagnosis not present

## 2021-05-08 DIAGNOSIS — E039 Hypothyroidism, unspecified: Secondary | ICD-10-CM | POA: Diagnosis not present

## 2021-05-22 DIAGNOSIS — Z1231 Encounter for screening mammogram for malignant neoplasm of breast: Secondary | ICD-10-CM | POA: Diagnosis not present

## 2021-05-31 DIAGNOSIS — G8929 Other chronic pain: Secondary | ICD-10-CM | POA: Diagnosis not present

## 2021-05-31 DIAGNOSIS — M21612 Bunion of left foot: Secondary | ICD-10-CM | POA: Diagnosis not present

## 2021-05-31 DIAGNOSIS — M25511 Pain in right shoulder: Secondary | ICD-10-CM | POA: Diagnosis not present

## 2021-05-31 DIAGNOSIS — E785 Hyperlipidemia, unspecified: Secondary | ICD-10-CM | POA: Diagnosis not present

## 2021-05-31 DIAGNOSIS — I351 Nonrheumatic aortic (valve) insufficiency: Secondary | ICD-10-CM | POA: Diagnosis not present

## 2021-05-31 DIAGNOSIS — Z1211 Encounter for screening for malignant neoplasm of colon: Secondary | ICD-10-CM | POA: Diagnosis not present

## 2021-05-31 DIAGNOSIS — K219 Gastro-esophageal reflux disease without esophagitis: Secondary | ICD-10-CM | POA: Diagnosis not present

## 2021-05-31 DIAGNOSIS — M21611 Bunion of right foot: Secondary | ICD-10-CM | POA: Diagnosis not present

## 2021-05-31 DIAGNOSIS — R7303 Prediabetes: Secondary | ICD-10-CM | POA: Diagnosis not present

## 2021-10-04 DIAGNOSIS — I351 Nonrheumatic aortic (valve) insufficiency: Secondary | ICD-10-CM | POA: Diagnosis not present

## 2021-10-04 DIAGNOSIS — E785 Hyperlipidemia, unspecified: Secondary | ICD-10-CM | POA: Diagnosis not present

## 2021-10-04 DIAGNOSIS — R7303 Prediabetes: Secondary | ICD-10-CM | POA: Diagnosis not present

## 2021-10-04 DIAGNOSIS — K219 Gastro-esophageal reflux disease without esophagitis: Secondary | ICD-10-CM | POA: Diagnosis not present

## 2021-10-04 DIAGNOSIS — H547 Unspecified visual loss: Secondary | ICD-10-CM | POA: Diagnosis not present

## 2021-10-29 DIAGNOSIS — D509 Iron deficiency anemia, unspecified: Secondary | ICD-10-CM | POA: Diagnosis not present

## 2021-10-29 DIAGNOSIS — R7309 Other abnormal glucose: Secondary | ICD-10-CM | POA: Diagnosis not present

## 2021-11-13 DIAGNOSIS — N951 Menopausal and female climacteric states: Secondary | ICD-10-CM | POA: Diagnosis not present

## 2021-11-13 DIAGNOSIS — Z1231 Encounter for screening mammogram for malignant neoplasm of breast: Secondary | ICD-10-CM | POA: Diagnosis not present

## 2021-11-13 DIAGNOSIS — Z0000 Encounter for general adult medical examination without abnormal findings: Secondary | ICD-10-CM | POA: Diagnosis not present

## 2022-01-07 DIAGNOSIS — H353131 Nonexudative age-related macular degeneration, bilateral, early dry stage: Secondary | ICD-10-CM | POA: Diagnosis not present

## 2022-01-07 DIAGNOSIS — H43813 Vitreous degeneration, bilateral: Secondary | ICD-10-CM | POA: Diagnosis not present

## 2022-01-07 DIAGNOSIS — H04123 Dry eye syndrome of bilateral lacrimal glands: Secondary | ICD-10-CM | POA: Diagnosis not present

## 2022-02-25 DIAGNOSIS — S01512A Laceration without foreign body of oral cavity, initial encounter: Secondary | ICD-10-CM | POA: Diagnosis not present

## 2022-02-25 DIAGNOSIS — S0990XA Unspecified injury of head, initial encounter: Secondary | ICD-10-CM | POA: Diagnosis not present

## 2022-02-25 DIAGNOSIS — Z823 Family history of stroke: Secondary | ICD-10-CM | POA: Diagnosis not present

## 2022-02-25 DIAGNOSIS — E7800 Pure hypercholesterolemia, unspecified: Secondary | ICD-10-CM | POA: Diagnosis not present

## 2022-02-25 DIAGNOSIS — W01198A Fall on same level from slipping, tripping and stumbling with subsequent striking against other object, initial encounter: Secondary | ICD-10-CM | POA: Diagnosis not present

## 2022-02-25 DIAGNOSIS — S0993XA Unspecified injury of face, initial encounter: Secondary | ICD-10-CM | POA: Diagnosis not present

## 2022-02-25 DIAGNOSIS — S199XXA Unspecified injury of neck, initial encounter: Secondary | ICD-10-CM | POA: Diagnosis not present

## 2022-02-25 DIAGNOSIS — R519 Headache, unspecified: Secondary | ICD-10-CM | POA: Diagnosis not present

## 2022-02-25 DIAGNOSIS — M25562 Pain in left knee: Secondary | ICD-10-CM | POA: Diagnosis not present

## 2022-02-25 DIAGNOSIS — Z880 Allergy status to penicillin: Secondary | ICD-10-CM | POA: Diagnosis not present

## 2022-02-25 DIAGNOSIS — M542 Cervicalgia: Secondary | ICD-10-CM | POA: Diagnosis not present

## 2022-02-25 DIAGNOSIS — K219 Gastro-esophageal reflux disease without esophagitis: Secondary | ICD-10-CM | POA: Diagnosis not present

## 2022-02-25 DIAGNOSIS — M25561 Pain in right knee: Secondary | ICD-10-CM | POA: Diagnosis not present

## 2022-02-25 DIAGNOSIS — W010XXA Fall on same level from slipping, tripping and stumbling without subsequent striking against object, initial encounter: Secondary | ICD-10-CM | POA: Diagnosis not present

## 2022-04-01 DIAGNOSIS — E785 Hyperlipidemia, unspecified: Secondary | ICD-10-CM | POA: Diagnosis not present

## 2022-04-01 DIAGNOSIS — M21612 Bunion of left foot: Secondary | ICD-10-CM | POA: Diagnosis not present

## 2022-04-01 DIAGNOSIS — M8580 Other specified disorders of bone density and structure, unspecified site: Secondary | ICD-10-CM | POA: Diagnosis not present

## 2022-04-01 DIAGNOSIS — M19041 Primary osteoarthritis, right hand: Secondary | ICD-10-CM | POA: Diagnosis not present

## 2022-04-01 DIAGNOSIS — K219 Gastro-esophageal reflux disease without esophagitis: Secondary | ICD-10-CM | POA: Diagnosis not present

## 2022-04-01 DIAGNOSIS — R059 Cough, unspecified: Secondary | ICD-10-CM | POA: Diagnosis not present

## 2022-04-01 DIAGNOSIS — M21611 Bunion of right foot: Secondary | ICD-10-CM | POA: Diagnosis not present

## 2022-04-01 DIAGNOSIS — Z1159 Encounter for screening for other viral diseases: Secondary | ICD-10-CM | POA: Diagnosis not present

## 2022-04-01 DIAGNOSIS — R7303 Prediabetes: Secondary | ICD-10-CM | POA: Diagnosis not present

## 2022-04-08 DIAGNOSIS — M549 Dorsalgia, unspecified: Secondary | ICD-10-CM | POA: Diagnosis not present

## 2022-04-08 DIAGNOSIS — S161XXD Strain of muscle, fascia and tendon at neck level, subsequent encounter: Secondary | ICD-10-CM | POA: Diagnosis not present

## 2022-04-08 DIAGNOSIS — M542 Cervicalgia: Secondary | ICD-10-CM | POA: Diagnosis not present

## 2022-04-08 DIAGNOSIS — M546 Pain in thoracic spine: Secondary | ICD-10-CM | POA: Diagnosis not present

## 2022-04-16 DIAGNOSIS — M546 Pain in thoracic spine: Secondary | ICD-10-CM | POA: Diagnosis not present

## 2022-04-16 DIAGNOSIS — S161XXD Strain of muscle, fascia and tendon at neck level, subsequent encounter: Secondary | ICD-10-CM | POA: Diagnosis not present

## 2022-04-16 DIAGNOSIS — M549 Dorsalgia, unspecified: Secondary | ICD-10-CM | POA: Diagnosis not present

## 2022-04-16 DIAGNOSIS — M542 Cervicalgia: Secondary | ICD-10-CM | POA: Diagnosis not present

## 2022-04-23 DIAGNOSIS — M542 Cervicalgia: Secondary | ICD-10-CM | POA: Diagnosis not present

## 2022-04-23 DIAGNOSIS — S161XXD Strain of muscle, fascia and tendon at neck level, subsequent encounter: Secondary | ICD-10-CM | POA: Diagnosis not present

## 2022-04-23 DIAGNOSIS — M546 Pain in thoracic spine: Secondary | ICD-10-CM | POA: Diagnosis not present

## 2022-04-23 DIAGNOSIS — M549 Dorsalgia, unspecified: Secondary | ICD-10-CM | POA: Diagnosis not present

## 2022-05-23 DIAGNOSIS — Z1231 Encounter for screening mammogram for malignant neoplasm of breast: Secondary | ICD-10-CM | POA: Diagnosis not present

## 2022-05-28 DIAGNOSIS — E7849 Other hyperlipidemia: Secondary | ICD-10-CM | POA: Diagnosis not present

## 2022-05-28 DIAGNOSIS — R7309 Other abnormal glucose: Secondary | ICD-10-CM | POA: Diagnosis not present

## 2022-05-28 DIAGNOSIS — E039 Hypothyroidism, unspecified: Secondary | ICD-10-CM | POA: Diagnosis not present

## 2022-05-30 DIAGNOSIS — Z7982 Long term (current) use of aspirin: Secondary | ICD-10-CM | POA: Diagnosis not present

## 2022-05-30 DIAGNOSIS — Z880 Allergy status to penicillin: Secondary | ICD-10-CM | POA: Diagnosis not present

## 2022-05-30 DIAGNOSIS — Z8249 Family history of ischemic heart disease and other diseases of the circulatory system: Secondary | ICD-10-CM | POA: Diagnosis not present

## 2022-05-30 DIAGNOSIS — I159 Secondary hypertension, unspecified: Secondary | ICD-10-CM | POA: Diagnosis not present

## 2022-05-30 DIAGNOSIS — R079 Chest pain, unspecified: Secondary | ICD-10-CM | POA: Diagnosis not present

## 2022-05-30 DIAGNOSIS — K219 Gastro-esophageal reflux disease without esophagitis: Secondary | ICD-10-CM | POA: Diagnosis not present

## 2022-05-30 DIAGNOSIS — E7800 Pure hypercholesterolemia, unspecified: Secondary | ICD-10-CM | POA: Diagnosis not present

## 2022-05-30 DIAGNOSIS — M810 Age-related osteoporosis without current pathological fracture: Secondary | ICD-10-CM | POA: Diagnosis not present

## 2022-05-30 DIAGNOSIS — R0789 Other chest pain: Secondary | ICD-10-CM | POA: Diagnosis not present

## 2022-06-03 DIAGNOSIS — F419 Anxiety disorder, unspecified: Secondary | ICD-10-CM | POA: Diagnosis not present

## 2022-06-03 DIAGNOSIS — R079 Chest pain, unspecified: Secondary | ICD-10-CM | POA: Diagnosis not present

## 2022-06-19 DIAGNOSIS — E785 Hyperlipidemia, unspecified: Secondary | ICD-10-CM | POA: Diagnosis not present

## 2022-06-19 DIAGNOSIS — I10 Essential (primary) hypertension: Secondary | ICD-10-CM | POA: Diagnosis not present

## 2022-06-19 DIAGNOSIS — F419 Anxiety disorder, unspecified: Secondary | ICD-10-CM | POA: Diagnosis not present

## 2022-06-19 DIAGNOSIS — M810 Age-related osteoporosis without current pathological fracture: Secondary | ICD-10-CM | POA: Diagnosis not present

## 2022-06-19 DIAGNOSIS — R7303 Prediabetes: Secondary | ICD-10-CM | POA: Diagnosis not present

## 2022-06-19 DIAGNOSIS — M19042 Primary osteoarthritis, left hand: Secondary | ICD-10-CM | POA: Diagnosis not present

## 2022-06-19 DIAGNOSIS — K219 Gastro-esophageal reflux disease without esophagitis: Secondary | ICD-10-CM | POA: Diagnosis not present

## 2022-06-19 DIAGNOSIS — M19041 Primary osteoarthritis, right hand: Secondary | ICD-10-CM | POA: Diagnosis not present

## 2022-07-01 DIAGNOSIS — I10 Essential (primary) hypertension: Secondary | ICD-10-CM | POA: Diagnosis not present

## 2022-07-01 DIAGNOSIS — F419 Anxiety disorder, unspecified: Secondary | ICD-10-CM | POA: Diagnosis not present

## 2022-07-08 DIAGNOSIS — I10 Essential (primary) hypertension: Secondary | ICD-10-CM | POA: Diagnosis not present

## 2022-08-22 DIAGNOSIS — H16223 Keratoconjunctivitis sicca, not specified as Sjogren's, bilateral: Secondary | ICD-10-CM | POA: Diagnosis not present

## 2022-08-22 DIAGNOSIS — H40013 Open angle with borderline findings, low risk, bilateral: Secondary | ICD-10-CM | POA: Diagnosis not present

## 2022-08-22 DIAGNOSIS — Z961 Presence of intraocular lens: Secondary | ICD-10-CM | POA: Diagnosis not present

## 2022-08-22 DIAGNOSIS — H18413 Arcus senilis, bilateral: Secondary | ICD-10-CM | POA: Diagnosis not present

## 2022-09-17 ENCOUNTER — Ambulatory Visit: Payer: Medicare PPO | Admitting: Family Medicine

## 2022-10-24 ENCOUNTER — Ambulatory Visit: Payer: Medicare PPO | Admitting: Family Medicine

## 2022-10-24 ENCOUNTER — Encounter: Payer: Self-pay | Admitting: Family Medicine

## 2022-10-24 DIAGNOSIS — I10 Essential (primary) hypertension: Secondary | ICD-10-CM | POA: Diagnosis not present

## 2022-10-24 DIAGNOSIS — E785 Hyperlipidemia, unspecified: Secondary | ICD-10-CM

## 2022-10-24 MED ORDER — LOSARTAN POTASSIUM 25 MG PO TABS: 25.0000 mg | ORAL_TABLET | Freq: Every day | ORAL | 1 refills | Status: DC

## 2022-10-24 NOTE — Progress Notes (Unsigned)
Established Patient Office Visit  Subjective   Patient ID: Claire [name], female    DOB: Jul 02, 1950  Age: 72 y.o. MRN: [id number]  Chief Complaint  Patient presents with   Annual Exam    HPI  {History (Optional):23778} Claire [name] is here for medical follow-up.  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  Unfortunately, there 73 year old son who lives in [place] area was diagnosed with rectal cancer stage III and they are here now on the [place] to help support them.  They plan to be here again in August and she would like to set up physical at that time.  She has hyperlipidemia treated with atorvastatin 40 mg daily.  She was diagnosed with hypertension earlier this year and started on losartan 25 mg daily.  Needs refills.  Home blood pressures have generally been fairly well-controlled.  She is also been dealing with the stress of her husband who had stroke last year.  He had some other health concerns as well.  Patient relates she had colonoscopy when they are back in [place] couple years ago.  She thinks she may have had 1 adenomatous polyp.  Past Medical History:  Diagnosis Date   Arthritis    Cataracts, bilateral 2018   in November; dr. Darel Hong   GERD (gastroesophageal reflux disease)    History of TIA (transient ischemic attack) 05/2019   Hyperlipidemia    Osteopenia    Positive TB test    Past Surgical History:  Procedure Laterality Date   BREAST BIOPSY Left    No noticable scar, Benign    BREAST EXCISIONAL BIOPSY Left 1996   CESAREAN SECTION  1982   ESOPHAGOGASTRODUODENOSCOPY ENDOSCOPY  01/01/2018   SHOULDER ARTHROSCOPY Left 2012   left   WRIST FRACTURE SURGERY Right 2004   Right    reports that she has never smoked. She has never used smokeless tobacco. She reports that she does not drink alcohol and does not use drugs. family history includes Hyperlipidemia in her father; Lymphoma in her  brother. Allergies  Allergen Reactions   Penicillins Anaphylaxis    ROS    Objective:     BP 130/60 (BP Location: Left Arm, Patient Position: Sitting, Cuff Size: Normal)   Pulse 70   Temp 97.8 F (36.6 C) (Oral)   Ht 5\' 1"  (1.549 m)   Wt 122 lb (55.3 kg)   SpO2 97%   BMI 23.05 kg/m  {Vitals History (Optional):23777}  Physical Exam Vitals reviewed.  Constitutional:      Appearance: She is well-developed.  Eyes:     Pupils: Pupils are equal, round, and reactive to light.  Neck:     Thyroid: No thyromegaly.     Vascular: No JVD.  Cardiovascular:     Rate and Rhythm: Normal rate and regular rhythm.     Heart sounds:     No gallop.  Pulmonary:     Effort: Pulmonary effort is normal. No respiratory distress.     Breath sounds: Normal breath sounds. No wheezing or rales.  Musculoskeletal:     Cervical back: Neck supple.  Neurological:     Mental Status: She is alert.      No results found for any visits on 10/24/22.  {Labs (Optional):23779}  The 10-year ASCVD risk score (Arnett DK, et al., 2019) is: 17.2%    Assessment & Plan:   #1 hyperlipidemia.  Patient on high-dose statin  with atorvastatin 40 mg daily.  Return in August for physical and plan to obtain lipids and chemistries at that time  #2 hypertension.  Repeat blood pressure left arm seated today after rest 138/64.  Refill losartan 25 mg daily #90 with 1 refill.  Continue low-sodium diet.  Set up complete physical for August   Evelena Peat, MD

## 2022-10-24 NOTE — Patient Instructions (Signed)
Set up complete physical.   

## 2022-10-28 ENCOUNTER — Ambulatory Visit: Payer: Medicare PPO | Admitting: Family Medicine

## 2022-11-13 ENCOUNTER — Encounter (INDEPENDENT_AMBULATORY_CARE_PROVIDER_SITE_OTHER): Payer: Self-pay

## 2022-11-20 DIAGNOSIS — H40013 Open angle with borderline findings, low risk, bilateral: Secondary | ICD-10-CM | POA: Diagnosis not present

## 2022-12-17 ENCOUNTER — Encounter: Payer: Medicare PPO | Admitting: Family Medicine

## 2022-12-24 ENCOUNTER — Ambulatory Visit: Payer: Medicare PPO

## 2022-12-24 ENCOUNTER — Encounter: Payer: Self-pay | Admitting: Family Medicine

## 2022-12-24 ENCOUNTER — Ambulatory Visit: Payer: Medicare PPO | Admitting: Family Medicine

## 2022-12-24 VITALS — BP 120/60 | HR 80 | Temp 98.7°F | Ht 60.24 in | Wt 121.0 lb

## 2022-12-24 VITALS — BP 120/60 | HR 80 | Temp 98.7°F | Ht 60.24 in | Wt 121.2 lb

## 2022-12-24 DIAGNOSIS — Z0000 Encounter for general adult medical examination without abnormal findings: Secondary | ICD-10-CM

## 2022-12-24 DIAGNOSIS — R739 Hyperglycemia, unspecified: Secondary | ICD-10-CM | POA: Diagnosis not present

## 2022-12-24 DIAGNOSIS — J0100 Acute maxillary sinusitis, unspecified: Secondary | ICD-10-CM

## 2022-12-24 DIAGNOSIS — Z0001 Encounter for general adult medical examination with abnormal findings: Secondary | ICD-10-CM

## 2022-12-24 DIAGNOSIS — E785 Hyperlipidemia, unspecified: Secondary | ICD-10-CM | POA: Diagnosis not present

## 2022-12-24 DIAGNOSIS — R7989 Other specified abnormal findings of blood chemistry: Secondary | ICD-10-CM

## 2022-12-24 MED ORDER — DOXYCYCLINE HYCLATE 100 MG PO CAPS: 100.0000 mg | ORAL_CAPSULE | Freq: Two times a day (BID) | ORAL | 0 refills | Status: AC

## 2022-12-24 NOTE — Patient Instructions (Addendum)
Ms. Gitchell , Thank you for taking time to come for your Medicare Wellness Visit. I appreciate your ongoing commitment to your health goals. Please review the following plan we discussed and let me know if I can assist you in the future.   Referrals/Orders/Follow-Ups/Clinician Recommendations:   This is a list of the screening recommended for you and due dates:  Health Maintenance  Topic Date Due   COVID-19 Vaccine (4 - 2023-24 season) 11/30/2022   Flu Shot  06/29/2023*   Medicare Annual Wellness Visit  12/24/2023   Mammogram  05/23/2024   Colon Cancer Screening  07/11/2031   DTaP/Tdap/Td vaccine (4 - Td or Tdap) 12/19/2031   Pneumonia Vaccine  Completed   DEXA scan (bone density measurement)  Completed   Hepatitis C Screening  Completed   Zoster (Shingles) Vaccine  Completed   HPV Vaccine  Aged Out  *Topic was postponed. The date shown is not the original due date.    Advanced directives: (Copy Requested) Please bring a copy of your health care power of attorney and living will to the office to be added to your chart at your convenience.  Next Medicare Annual Wellness Visit scheduled for next year: Yes

## 2022-12-24 NOTE — Patient Instructions (Signed)
Remember to get flu vaccine later this Fall.

## 2022-12-24 NOTE — Progress Notes (Addendum)
Subjective:   Claire [name] is a 72 y.o. female who presents for Medicare Annual (Subsequent) preventive examination.  Visit Complete: In person    Cardiac Risk Factors include: advanced age (>71men, >103 women)     Objective:    Today's Vitals   12/24/22 1058  BP: 120/60  Pulse: 80  Temp: 98.7 F (37.1 C)  SpO2: 98%  Weight: 121 lb (54.9 kg)  Height: 5' 0.24" (1.53 m)   Body mass index is 23.44 kg/m.     12/24/2022   11:31 AM 10/28/2017    7:20 AM 10/07/2016    9:30 AM  Advanced Directives  Does Patient Have a Medical Advance Directive? Yes Yes Yes  Type of Estate agent of [place];Living will  Healthcare Power of [place];Living will  Does patient want to make changes to medical advance directive?   No - Patient declined  Copy of Healthcare Power of Attorney in Chart? No - copy requested  No - copy requested    Current Medications (verified) Outpatient Encounter Medications as of 12/24/2022  Medication Sig   Ascorbic Acid (VITAMIN C PO) Take by mouth.   aspirin 81 MG tablet Take 81 mg by mouth every other day.    atorvastatin (LIPITOR) 40 MG tablet Take 1 tablet by mouth daily.   B Complex Vitamins (VITAMIN-B COMPLEX PO) Take by mouth.   Calcium Carbonate (CALCIUM 600 PO) Take by mouth.   calcium carbonate (TUMS - DOSED IN MG ELEMENTAL CALCIUM) 500 MG chewable tablet Chew 1 tablet by mouth daily. As needed   famotidine (PEPCID) 20 MG tablet Take 20 mg by mouth daily. As needed   losartan (COZAAR) 25 MG tablet Take 1 tablet (25 mg total) by mouth daily.   Misc Natural Products (GLUCOSAMINE CHONDROITIN MSM) TABS Take by mouth.   Multiple Vitamin (MULTIVITAMIN) tablet Take 1 tablet by mouth daily.   pantoprazole (PROTONIX) 40 MG tablet TAKE 1 TABLET BY MOUTH EVERY DAY   RESTASIS 0.05 % ophthalmic emulsion INT 1 GTT IN OU BID   triamcinolone cream (KENALOG) 0.1 % Apply 1 application topically 2 (two) times daily as needed.   No  facility-administered encounter medications on file as of 12/24/2022.    Allergies (verified) Penicillins   History: Past Medical History:  Diagnosis Date   Arthritis    Cataracts, bilateral 2018   in November; dr. Darel Hong   GERD (gastroesophageal reflux disease)    History of TIA (transient ischemic attack) 05/2019   Hyperlipidemia    Osteopenia    Positive TB test    Past Surgical History:  Procedure Laterality Date   BREAST BIOPSY Left    No noticable scar, Benign    BREAST EXCISIONAL BIOPSY Left 1996   CESAREAN SECTION  1982   ESOPHAGOGASTRODUODENOSCOPY ENDOSCOPY  01/01/2018   SHOULDER ARTHROSCOPY Left 2012   left   WRIST FRACTURE SURGERY Right 2004   Right   Family History  Problem Relation Age of Onset   Hyperlipidemia Father    Lymphoma Brother    Breast cancer Neg Hx    Social History   Socioeconomic History   Marital status: Married    Spouse name: Eui   Number of children: 3   Years of education: Not on file   Highest education level: Bachelor's degree (e.g., BA, AB, BS)  Occupational History    Comment: retired  Tobacco Use   Smoking status: Never   Smokeless tobacco: Never  Vaping Use   Vaping status: Never  Used  Substance and Sexual Activity   Alcohol use: No   Drug use: Never   Sexual activity: Not on file  Other Topics Concern   Not on file  Social History Narrative   Lives with husband   Caffeine- coffee 1 c daily   Social Determinants of Health   Financial Resource Strain: Low Risk  (12/24/2022)   Overall Financial Resource Strain (CARDIA)    Difficulty of Paying Living Expenses: Not hard at all  Food Insecurity: No Food Insecurity (12/24/2022)   Hunger Vital Sign    Worried About Running Out of Food in the Last Year: Never true    Ran Out of Food in the Last Year: Never true  Transportation Needs: No Transportation Needs (12/24/2022)   PRAPARE - Administrator, Civil Service (Medical): No    Lack of Transportation  (Non-Medical): No  Physical Activity: Sufficiently Active (12/24/2022)   Exercise Vital Sign    Days of Exercise per Week: 3 days    Minutes of Exercise per Session: 60 min  Recent Concern: Physical Activity - Insufficiently Active (12/23/2022)   Exercise Vital Sign    Days of Exercise per Week: 3 days    Minutes of Exercise per Session: 30 min  Stress: No Stress Concern Present (12/24/2022)   [employer] of Occupational Health - Occupational Stress Questionnaire    Feeling of Stress : Not at all  Recent Concern: Stress - Stress Concern Present (12/23/2022)   [employer] of Occupational Health - Occupational Stress Questionnaire    Feeling of Stress : To some extent  Social Connections: Socially Integrated (12/24/2022)   Social Connection and Isolation Panel [NHANES]    Frequency of Communication with Friends and Family: More than three times a week    Frequency of Social Gatherings with Friends and Family: More than three times a week    Attends Religious Services: More than 4 times per year    Active Member of [employer] or Organizations: Yes    Attends Engineer, structural: More than 4 times per year    Marital Status: Married    Tobacco Counseling Counseling given: Not Answered   Clinical Intake:  Pre-visit preparation completed: Yes  Pain : No/denies pain     BMI - recorded: 23.44 Nutritional Status: BMI of 19-24  Normal Nutritional Risks: None Diabetes: No  How often do you need to have someone help you when you read instructions, pamphlets, or other written materials from your doctor or pharmacy?: 1 - Never  Interpreter Needed?: No  Information entered by :: Theresa Mulligan LPN   Activities of Daily Living    12/24/2022   11:29 AM  In your present state of health, do you have any difficulty performing the following activities:  Hearing? 0  Vision? 0  Difficulty concentrating or making decisions? 0  Walking or climbing stairs? 0  Dressing or  bathing? 0  Doing errands, shopping? 0  Preparing Food and eating ? N  Using the Toilet? N  In the past six months, have you accidently leaked urine? N  Do you have problems with loss of bowel control? N  Managing your Medications? N  Managing your Finances? N  Housekeeping or managing your Housekeeping? N    Patient Care Team: Kristian Covey, MD as PCP - General (Family Medicine)  Indicate any recent Medical Services you may have received from other than [facility] providers in the past year (date may be approximate).  Assessment:   This is a routine wellness examination for Zowie.  Hearing/Vision screen Hearing Screening - Comments:: Denies hearing difficulties   Vision Screening - Comments:: Wears rx glasses - up to date with routine eye exams with  Dr Vonna Kotyk   Goals Addressed               This Visit's Progress     Increase physical activity (pt-stated)        Get off B/P meds.       Depression Screen    12/24/2022   10:58 AM 10/24/2022    8:14 AM 11/21/2019   10:29 AM 11/09/2018    9:05 AM 10/28/2017    7:22 AM 10/07/2016    9:30 AM 10/07/2016    8:53 AM  PHQ 2/9 Scores  PHQ - 2 Score 0 0 0 0 0 0 0  PHQ- 9 Score    0       Fall Risk    12/24/2022   11:29 AM 10/24/2022    8:14 AM 12/27/2019    2:00 PM 11/21/2019   10:25 AM 11/09/2018    9:05 AM  Fall Risk   Falls in the past year? 1 1 0 0 0  Number falls in past yr: 0 0  0   Injury with Fall? 0 0  0   Comment Followed by medical attention      Risk for fall due to : No Fall Risks No Fall Risks     Follow up Falls prevention discussed Falls evaluation completed       MEDICARE RISK AT HOME: Medicare Risk at Home Any stairs in or around the home?: No If so, are there any without handrails?: No Home free of loose throw rugs in walkways, pet beds, electrical cords, etc?: Yes Adequate lighting in your home to reduce risk of falls?: Yes Life alert?: No Use of a cane, walker or w/c?: No Grab bars in the  bathroom?: No Shower chair or bench in shower?: No Elevated toilet seat or a handicapped toilet?: No  TIMED UP AND GO:  Was the test performed?  Yes  Length of time to ambulate 10 feet: 10 sec Gait steady and fast without use of assistive device    Cognitive Function:    10/28/2017    7:24 AM  MMSE - Mini Mental State Exam  Not completed: --        12/24/2022   11:31 AM  6CIT Screen  What Year? 0 points  What month? 0 points  What time? 0 points  Count back from 20 0 points  Months in reverse 0 points  Repeat phrase 0 points  Total Score 0 points    Immunizations Immunization History  Administered Date(s) Administered   DT (Pediatric) 09/02/2002, 09/02/2011   Hepatitis A 02/16/2013, 07/27/2013   Influenza, High Dose Seasonal PF 12/07/2015, 01/21/2017, 01/11/2019   Influenza-Unspecified 02/05/2012, 01/04/2013, 01/30/2015, 12/25/2017   PFIZER(Purple Top)SARS-COV-2 Vaccination 05/26/2019, 06/16/2019, 11/28/2019   Pneumococcal Conjugate-13 10/03/2014   Pneumococcal Polysaccharide-23 10/22/2015   Tdap 12/18/2021   Zoster Recombinant(Shingrix) 12/02/2017, 02/03/2018, 02/18/2018   Zoster, Live 09/01/2008    TDAP status: Up to date  Flu Vaccine status: Due, Education has been provided regarding the importance of this vaccine. Advised may receive this vaccine at local pharmacy or Health Dept. Aware to provide a copy of the vaccination record if obtained from local pharmacy or Health Dept. Verbalized acceptance and understanding.  Pneumococcal vaccine status: Up to date  Covid-19  vaccine status: Declined, Education has been provided regarding the importance of this vaccine but patient still declined. Advised may receive this vaccine at local pharmacy or Health Dept.or vaccine clinic. Aware to provide a copy of the vaccination record if obtained from local pharmacy or Health Dept. Verbalized acceptance and understanding.  Qualifies for Shingles Vaccine? Yes   Zostavax  completed Yes   Shingrix Completed?: Yes  Screening Tests Health Maintenance  Topic Date Due   COVID-19 Vaccine (4 - 2023-24 season) 11/30/2022   INFLUENZA VACCINE  06/29/2023 (Originally 10/30/2022)   Medicare Annual Wellness (AWV)  12/24/2023   MAMMOGRAM  05/23/2024   Colonoscopy  07/11/2031   DTaP/Tdap/Td (4 - Td or Tdap) 12/19/2031   Pneumonia Vaccine 6+ Years old  Completed   DEXA SCAN  Completed   Hepatitis C Screening  Completed   Zoster Vaccines- Shingrix  Completed   HPV VACCINES  Aged Out    Health Maintenance  Health Maintenance Due  Topic Date Due   COVID-19 Vaccine (4 - 2023-24 season) 11/30/2022    Colorectal cancer screening: Type of screening: Colonoscopy. Completed 07/10/21. Repeat every 10 years  Mammogram status: Completed 05/23/22. Repeat every year  Bone Density status: Completed 12/10/16. Results reflect: Bone density results: OSTEOPENIA. Repeat every   years.   Additional Screening:  Hepatitis C Screening: does qualify; Completed 10/07/16  Vision Screening: Recommended annual ophthalmology exams for early detection of glaucoma and other disorders of the eye. Is the patient up to date with their annual eye exam?  Yes  Who is the provider or what is the name of the office in which the patient attends annual eye exams? Dr Vonna Kotyk If pt is not established with a provider, would they like to be referred to a provider to establish care? No .   Dental Screening: Recommended annual dental exams for proper oral hygiene    Community Resource Referral / Chronic Care Management:  CRR required this visit?  No   CCM required this visit?  No     Plan:     I have personally reviewed and noted the following in the patient's chart:   Medical and social history Use of alcohol, tobacco or illicit drugs  Current medications and supplements including opioid prescriptions. Patient is not currently taking opioid prescriptions. Functional ability and  status Nutritional status Physical activity Advanced directives List of other physicians Hospitalizations, surgeries, and ER visits in previous 12 months Vitals Screenings to include cognitive, depression, and falls Referrals and appointments  In addition, I have reviewed and discussed with patient certain preventive protocols, quality metrics, and best practice recommendations. A written personalized care plan for preventive services as well as general preventive health recommendations were provided to patient.     Tillie Rung, LPN   05/20/2540   After Visit Summary: Given  Nurse Notes: None

## 2022-12-24 NOTE — Progress Notes (Signed)
Established Patient Office Visit  Subjective   Patient ID: Virga Koelsch, female    DOB: [birth year]  Age: 72 y.o. MRN: [id number]  Chief Complaint  Patient presents with   Annual Exam    HPI   Ms. Webbe is seen for physical exam-and separate acute issue as below.  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  They have moved back to the [facility] temporarily to be near their son who is 59 years old and has cancer and is being treated in the [place] area. Her husband also has pancreas lesion which has been followed at [facility].  He has follow-up there soon.  She relates some sinusitis symptoms with over 2-week history of bilateral facial pain and purulent secretions and some malaise.  Intermittent headaches.  Symptoms not improving.  No cough.  No fever.  Has had history of fairly frequent sinusitis in the past with very similar symptoms.  No known sick contacts.  Health maintenance reviewed  Health Maintenance  Topic Date Due   COVID-19 Vaccine (4 - 2023-24 season) 11/30/2022   INFLUENZA VACCINE  06/29/2023 (Originally 10/30/2022)   Medicare Annual Wellness (AWV)  12/24/2023   MAMMOGRAM  05/23/2024   Colonoscopy  07/11/2031   DTaP/Tdap/Td (4 - Td or Tdap) 12/19/2031   Pneumonia Vaccine 36+ Years old  Completed   DEXA SCAN  Completed   Hepatitis C Screening  Completed   Zoster Vaccines- Shingrix  Completed   HPV VACCINES  Aged Out   -She had colonoscopy in [place] April 2023 with reported adenoma. -She had mammogram earlier this year along with DEXA scan. -Has not had flu vaccine yet but she requests deferring this until she is over recurrent sinus infection.  Family history-mother died age 77 motor vehicle accident.  Father died  age 61.  He did have some coronary disease in his 30s.  3 siblings.  Brother with lymphoma around age 88.  Social history-she is married.  3 children.  Youngest lives in [place] area.  They have a daughter lives in [place] and another son that lives in [place] and does mission work.  Past Medical History:  Diagnosis Date   Arthritis    Cataracts, bilateral 2018   in November; dr. Darel Hong   GERD (gastroesophageal reflux disease)    History of TIA (transient ischemic attack) 05/2019   Hyperlipidemia    Osteopenia    Positive TB test    Past Surgical History:  Procedure Laterality Date   BREAST BIOPSY Left    No noticable scar, Benign    BREAST EXCISIONAL BIOPSY Left 1996   CESAREAN SECTION  1982   ESOPHAGOGASTRODUODENOSCOPY ENDOSCOPY  01/01/2018   SHOULDER ARTHROSCOPY Left 2012   left   WRIST FRACTURE SURGERY Right 2004   Right    reports that she has never smoked. She has never used smokeless tobacco. She reports that she does not drink alcohol and does not use drugs. family history includes Hyperlipidemia in her father; Lymphoma in her brother. Allergies  Allergen Reactions   Penicillins Anaphylaxis    Review of Systems  Constitutional:  Negative for chills, fever, malaise/fatigue and weight loss.  HENT:  Negative for hearing loss.   Eyes:  Negative for blurred vision and double vision.  Respiratory:  Negative for cough and shortness of breath.   Cardiovascular:  Negative for chest pain, palpitations and leg swelling.  Gastrointestinal:  Negative for abdominal pain, blood in stool, constipation and diarrhea.  Genitourinary:  Negative for dysuria.  Skin:  Negative for rash.  Neurological:  Negative for dizziness, speech change, seizures, loss of consciousness and headaches.  Psychiatric/Behavioral:  Negative for depression.       Objective:     BP 120/60 (BP Location: Left Arm, Patient Position: Sitting, Cuff Size: Normal)   Pulse 80   Temp 98.7 F (37.1 C)  (Oral)   Ht 5' 0.24" (1.53 m)   Wt 121 lb 3.2 oz (55 kg)   SpO2 98%   BMI 23.49 kg/m  BP Readings from Last 3 Encounters:  12/24/22 120/60  12/24/22 120/60  10/24/22 130/60   Wt Readings from Last 3 Encounters:  12/24/22 121 lb (54.9 kg)  12/24/22 121 lb 3.2 oz (55 kg)  10/24/22 122 lb (55.3 kg)      Physical Exam Vitals reviewed.  Constitutional:      General: She is not in acute distress.    Appearance: She is well-developed. She is not ill-appearing.  HENT:     Head: Normocephalic and atraumatic.     Mouth/Throat:     Mouth: Mucous membranes are moist.     Pharynx: Oropharynx is clear. No oropharyngeal exudate.  Eyes:     Pupils: Pupils are equal, round, and reactive to light.  Neck:     Thyroid: No thyromegaly.  Cardiovascular:     Rate and Rhythm: Normal rate and regular rhythm.     Heart sounds: Normal heart sounds. No murmur heard. Pulmonary:     Effort: No respiratory distress.     Breath sounds: Normal breath sounds. No wheezing or rales.  Abdominal:     General: Bowel sounds are normal. There is no distension.     Palpations: Abdomen is soft. There is no mass.     Tenderness: There is no abdominal tenderness. There is no guarding or rebound.  Musculoskeletal:        General: Normal range of motion.     Cervical back: Normal range of motion and neck supple.     Comments: Chronic bunions of both feet.  Very small calluses MTP joint.  No ulceration.  Lymphadenopathy:     Cervical: No cervical adenopathy.  Skin:    Findings: No rash.  Neurological:     Mental Status: She is alert and oriented to person, place, and time.     Cranial Nerves: No cranial nerve deficit.  Psychiatric:        Behavior: Behavior normal.        Thought Content: Thought content normal.        Judgment: Judgment normal.      No results found for any visits on 12/24/22.  Last CBC Lab Results  Component Value Date   WBC 4.6 02/16/2019   HGB 13.2 02/16/2019   HCT 39.8  02/16/2019   MCV 92.1 02/16/2019   MCH 30.0 08/31/2015   RDW 13.1 02/16/2019   PLT 280.0 02/16/2019   Last metabolic panel Lab Results  Component Value Date   GLUCOSE 94 11/21/2019   NA 142 11/21/2019   K 4.5 11/21/2019   CL 105 11/21/2019   CO2 29 11/21/2019   BUN 12 11/21/2019   CREATININE 0.77 11/21/2019   GFR 78.84 11/09/2018   CALCIUM 9.5 11/21/2019   PROT 6.8 10/14/2017  ALBUMIN 4.3 10/14/2017   BILITOT 1.0 10/14/2017   ALKPHOS 52 10/28/2018   AST 25 10/28/2018   ALT 25 10/28/2018   Last lipids Lab Results  Component Value Date   CHOL 167 11/21/2019   HDL 54 11/21/2019   LDLCALC 88 11/21/2019   TRIG 148 11/21/2019   CHOLHDL 3.1 11/21/2019   Last hemoglobin A1c Lab Results  Component Value Date   HGBA1C 5.8 (H) 11/21/2019   Last thyroid functions Lab Results  Component Value Date   TSH 3.09 11/09/2018      The ASCVD Risk score (Arnett DK, et al., 2019) failed to calculate for the following reasons:   Cannot find a previous HDL lab   Cannot find a previous total cholesterol lab    Assessment & Plan:   #1 physical exam.  Discussed several health maintenance issues as follows -Recommend flu vaccine but she would like to wait a couple weeks until she gets over sinusitis -Other vaccines up-to-date -Reportedly had colonoscopy April 2023 with adenoma polyp.  Would presumably need follow-up in about 3 years from then -Mammogram up-to-date -Continue regular exercise habits  #2 history of abnormal TSH.  Recheck TSH today  #3 history of hyperlipidemia and TIAs.  Patient on high-dose atorvastatin.  Recheck lipid panel and basic chemistries  #4 history of mild hyperglycemia.  Check A1c with upcoming labs.  #5 probable acute bilateral maxillary sinusitis.  She has history of reported anaphylaxis with penicillins.  Start doxycycline 100 mg twice daily for 10 days.  Follow-up for any persistent or worsening symptoms   No follow-ups on file.    Evelena Peat, MD

## 2022-12-25 ENCOUNTER — Other Ambulatory Visit (INDEPENDENT_AMBULATORY_CARE_PROVIDER_SITE_OTHER): Payer: Medicare PPO

## 2022-12-25 ENCOUNTER — Telehealth: Payer: Self-pay | Admitting: Family Medicine

## 2022-12-25 DIAGNOSIS — E785 Hyperlipidemia, unspecified: Secondary | ICD-10-CM | POA: Diagnosis not present

## 2022-12-25 DIAGNOSIS — R7989 Other specified abnormal findings of blood chemistry: Secondary | ICD-10-CM | POA: Diagnosis not present

## 2022-12-25 DIAGNOSIS — R739 Hyperglycemia, unspecified: Secondary | ICD-10-CM

## 2022-12-25 LAB — HEMOGLOBIN A1C: Hgb A1c MFr Bld: 6.3 % (ref 4.6–6.5)

## 2022-12-25 LAB — CBC WITH DIFFERENTIAL/PLATELET
Basophils Absolute: 0 10*3/uL (ref 0.0–0.1)
Basophils Relative: 0.6 % (ref 0.0–3.0)
Eosinophils Absolute: 0.2 10*3/uL (ref 0.0–0.7)
Eosinophils Relative: 4.2 % (ref 0.0–5.0)
HCT: 38.2 % (ref 36.0–46.0)
Hemoglobin: 12.5 g/dL (ref 12.0–15.0)
Lymphocytes Relative: 32.3 % (ref 12.0–46.0)
Lymphs Abs: 1.5 10*3/uL (ref 0.7–4.0)
MCHC: 32.6 g/dL (ref 30.0–36.0)
MCV: 92.6 fl (ref 78.0–100.0)
Monocytes Absolute: 0.3 10*3/uL (ref 0.1–1.0)
Monocytes Relative: 6.4 % (ref 3.0–12.0)
Neutro Abs: 2.7 10*3/uL (ref 1.4–7.7)
Neutrophils Relative %: 56.5 % (ref 43.0–77.0)
Platelets: 265 10*3/uL (ref 150.0–400.0)
RBC: 4.13 Mil/uL (ref 3.87–5.11)
RDW: 12.8 % (ref 11.5–15.5)
WBC: 4.7 10*3/uL (ref 4.0–10.5)

## 2022-12-25 LAB — HEPATIC FUNCTION PANEL
ALT: 33 U/L (ref 0–35)
AST: 27 U/L (ref 0–37)
Albumin: 4 g/dL (ref 3.5–5.2)
Alkaline Phosphatase: 56 U/L (ref 39–117)
Bilirubin, Direct: 0 mg/dL (ref 0.0–0.3)
Total Bilirubin: 0.7 mg/dL (ref 0.2–1.2)
Total Protein: 6.7 g/dL (ref 6.0–8.3)

## 2022-12-25 LAB — BASIC METABOLIC PANEL
BUN: 16 mg/dL (ref 6–23)
CO2: 29 mEq/L (ref 19–32)
Calcium: 9.1 mg/dL (ref 8.4–10.5)
Chloride: 106 mEq/L (ref 96–112)
Creatinine, Ser: 0.85 mg/dL (ref 0.40–1.20)
GFR: 67.68 mL/min (ref >60.00)
Glucose, Bld: 107 mg/dL — ABNORMAL HIGH (ref 70–99)
Potassium: 4 mEq/L (ref 3.5–5.1)
Sodium: 142 mEq/L (ref 135–145)

## 2022-12-25 LAB — LIPID PANEL
Cholesterol: 139 mg/dL (ref 0–200)
HDL: 57.8 mg/dL (ref >39.00)
LDL Cholesterol: 64 mg/dL (ref 0–99)
NonHDL: 80.87
Total CHOL/HDL Ratio: 2
Triglycerides: 83 mg/dL (ref 0.0–149.0)
VLDL: 16.6 mg/dL (ref 0.0–40.0)

## 2022-12-25 LAB — TSH: TSH: 3.39 u[IU]/mL (ref 0.35–5.50)

## 2022-12-25 MED ORDER — BENZONATATE 100 MG PO CAPS: ORAL_CAPSULE | ORAL | 0 refills | Status: AC

## 2022-12-25 NOTE — Telephone Encounter (Signed)
 Patient informed of the results and voiced understanding

## 2022-12-25 NOTE — Telephone Encounter (Signed)
Please disregard message below. RX sent and left message informing the patient of this

## 2022-12-25 NOTE — Telephone Encounter (Signed)
Patient states she is still coughing and would like a cough syrup to be called in please.  Pharmacy- [facility] on [address].

## 2023-03-04 ENCOUNTER — Ambulatory Visit: Payer: Medicare PPO | Admitting: Podiatry

## 2023-03-04 ENCOUNTER — Encounter: Payer: Self-pay | Admitting: Podiatry

## 2023-03-04 ENCOUNTER — Ambulatory Visit (INDEPENDENT_AMBULATORY_CARE_PROVIDER_SITE_OTHER): Payer: Medicare PPO

## 2023-03-04 DIAGNOSIS — Z124 Encounter for screening for malignant neoplasm of cervix: Secondary | ICD-10-CM | POA: Diagnosis not present

## 2023-03-04 DIAGNOSIS — M2041 Other hammer toe(s) (acquired), right foot: Secondary | ICD-10-CM | POA: Diagnosis not present

## 2023-03-04 DIAGNOSIS — Z01411 Encounter for gynecological examination (general) (routine) with abnormal findings: Secondary | ICD-10-CM | POA: Diagnosis not present

## 2023-03-04 DIAGNOSIS — M778 Other enthesopathies, not elsewhere classified: Secondary | ICD-10-CM | POA: Diagnosis not present

## 2023-03-04 DIAGNOSIS — R319 Hematuria, unspecified: Secondary | ICD-10-CM | POA: Diagnosis not present

## 2023-03-04 DIAGNOSIS — M2042 Other hammer toe(s) (acquired), left foot: Secondary | ICD-10-CM

## 2023-03-04 DIAGNOSIS — M21619 Bunion of unspecified foot: Secondary | ICD-10-CM | POA: Diagnosis not present

## 2023-03-04 DIAGNOSIS — Z01419 Encounter for gynecological examination (general) (routine) without abnormal findings: Secondary | ICD-10-CM | POA: Diagnosis not present

## 2023-03-04 NOTE — Progress Notes (Signed)
Subjective:   Patient ID: Claire [name], female   DOB: 72 y.o.   MRN: [id number]   HPI Patient states her bunions have gradually gotten worse on both feet and states that she was in [place] for a number of years and has moved back to town.  Patient does not smoke likes to be active   Review of Systems  All other systems reviewed and are negative.       Objective:  Physical Exam Vitals and nursing note reviewed.  Constitutional:      Appearance: She is well-developed.  Pulmonary:     Effort: Pulmonary effort is normal.  Musculoskeletal:        General: Normal range of motion.  Skin:    General: Skin is warm.  Neurological:     Mental Status: She is alert.     Neurovascular status was found to be intact muscle strength was found to be within normal limits with the patient noted to have significant structural bunion deformity bilateral with deviation of the second toe right over left that is gradually worsened over the years.  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  Patient will be seen back to recheck and at 1 point may require surgery  X-rays indicate that there is significant elevation of the intermetatarsal angle bilateral not huge difference from 6 7 years ago with some increased deviation second digit right over left

## 2023-03-10 ENCOUNTER — Encounter: Payer: Self-pay | Admitting: Family Medicine

## 2023-03-10 ENCOUNTER — Ambulatory Visit (INDEPENDENT_AMBULATORY_CARE_PROVIDER_SITE_OTHER): Payer: Medicare PPO | Admitting: Family Medicine

## 2023-03-10 VITALS — BP 128/64 | HR 70 | Temp 97.8°F | Ht 60.0 in | Wt 126.6 lb

## 2023-03-10 DIAGNOSIS — I10 Essential (primary) hypertension: Secondary | ICD-10-CM | POA: Insufficient documentation

## 2023-03-10 DIAGNOSIS — K602 Anal fissure, unspecified: Secondary | ICD-10-CM

## 2023-03-10 DIAGNOSIS — K5900 Constipation, unspecified: Secondary | ICD-10-CM | POA: Diagnosis not present

## 2023-03-10 DIAGNOSIS — R739 Hyperglycemia, unspecified: Secondary | ICD-10-CM

## 2023-03-10 MED ORDER — PANTOPRAZOLE SODIUM 40 MG PO TBEC: DELAYED_RELEASE_TABLET | ORAL | 1 refills | Status: DC

## 2023-03-10 MED ORDER — LOSARTAN POTASSIUM 25 MG PO TABS: 25.0000 mg | ORAL_TABLET | Freq: Every day | ORAL | 1 refills | Status: AC

## 2023-03-10 NOTE — Progress Notes (Signed)
Established Patient Office Visit  Subjective   Patient ID: Claire [name], female    DOB: [birth year]  Age: 72 y.o. MRN: [id number]  Chief Complaint  Patient presents with   Hypertension   Blood In Stools    HPI   Ms. Remedies is seen with possible recent blood in stool.  She had couple bowel movements and had a bit of straining and noted what she described as "brick colored "stool.  Reddish-brown in color.  No blood with wiping.  Denies any perianal pain.  She has had frequent colonoscopies in the past including April of this past year in [place].  Reportedly had 1 hyperplastic polyp and 1 adenoma.  Also had EGD at that time with no acute abnormalities.  No recent aspirin use.  No appetite or weight changes.  Has had some recent constipation and straining intermittently.  She has history of hypertension treated with losartan 25 mg daily.  Has had some recent stress issues.  Her husband is currently followed at [facility] with pancreatic cyst which was recently reimaged.  He had some recent right upper quadrant abdominal pain and apparently his follow-up MRI scan showed inflammation of the gallbladder.  He is scheduled for laparoscopic cholecystectomy this coming Monday.  She feels the stress and anxiety have exacerbated her blood pressure.  She couple times is taken twice daily losartan 25 mg.  She brings in log of readings and these have been up and down  Past Medical History:  Diagnosis Date   Arthritis    Cataracts, bilateral 2018   in November; dr. Darel Hong   GERD (gastroesophageal reflux disease)    History of TIA (transient ischemic attack) 05/2019   Hyperlipidemia    Osteopenia    Positive TB test    Past Surgical History:  Procedure Laterality Date   BREAST BIOPSY Left    No noticable scar, Benign    BREAST EXCISIONAL BIOPSY Left 1996   CESAREAN SECTION  1982   ESOPHAGOGASTRODUODENOSCOPY ENDOSCOPY  01/01/2018   SHOULDER ARTHROSCOPY Left 2012   left   WRIST FRACTURE SURGERY Right  2004   Right    reports that she has never smoked. She has never used smokeless tobacco. She reports that she does not drink alcohol and does not use drugs. family history includes Hyperlipidemia in her father; Lymphoma in her brother. Allergies  Allergen Reactions   Penicillins Anaphylaxis    Review of Systems  Constitutional:  Negative for malaise/fatigue and weight loss.  Eyes:  Negative for blurred vision.  Respiratory:  Negative for shortness of breath.   Cardiovascular:  Negative for chest pain.  Gastrointestinal:  Positive for blood in stool and constipation. Negative for abdominal pain, melena, nausea and vomiting.  Neurological:  Negative for dizziness, weakness and headaches.      Objective:     BP 128/64 (BP Location: Left Arm, Patient Position: Sitting, Cuff Size: Normal)   Pulse 70   Temp 97.8 F (36.6 C) (Oral)   Ht 5' (1.524 m)   Wt 126 lb 9.6 oz (57.4 kg)   SpO2 95%   BMI 24.72 kg/m  BP Readings from Last 3 Encounters:  03/10/23 128/64  12/24/22 120/60  12/24/22 120/60   Wt Readings from Last 3 Encounters:  03/10/23 126 lb 9.6 oz (57.4 kg)  12/24/22 121 lb (54.9 kg)  12/24/22 121 lb 3.2 oz (55 kg)      Physical Exam Vitals reviewed.  Constitutional:      General: She is  not in acute distress.    Appearance: She is not ill-appearing.  Cardiovascular:     Rate and Rhythm: Normal rate and regular rhythm.  Pulmonary:     Effort: Pulmonary effort is normal.     Breath sounds: Normal breath sounds. No wheezing or rales.  Abdominal:     Palpations: Abdomen is soft.     Tenderness: There is no abdominal tenderness. There is no guarding or rebound.  Genitourinary:    Comments: Rectal exam reveals no external hemorrhoids.  She has very small anal fissure around the 6 o'clock position.  No active bleeding.  Digital exam reveals some firm stool in the rectal vault but no impaction.  Stool is brown and Hemoccult negative.  No other rectal masses  noted. Neurological:     Mental Status: She is alert.      No results found for any visits on 03/10/23.  Last CBC Lab Results  Component Value Date   WBC 4.7 12/25/2022   HGB 12.5 12/25/2022   HCT 38.2 12/25/2022   MCV 92.6 12/25/2022   MCH 30.0 08/31/2015   RDW 12.8 12/25/2022   PLT 265.0 12/25/2022   Last metabolic panel Lab Results  Component Value Date   GLUCOSE 107 (H) 12/25/2022   NA 142 12/25/2022   K 4.0 12/25/2022   CL 106 12/25/2022   CO2 29 12/25/2022   BUN 16 12/25/2022   CREATININE 0.85 12/25/2022   GFR 67.68 12/25/2022   CALCIUM 9.1 12/25/2022   PROT 6.7 12/25/2022   ALBUMIN 4.0 12/25/2022   BILITOT 0.7 12/25/2022   ALKPHOS 56 12/25/2022   AST 27 12/25/2022   ALT 33 12/25/2022   Last lipids Lab Results  Component Value Date   CHOL 139 12/25/2022   HDL 57.80 12/25/2022   LDLCALC 64 12/25/2022   TRIG 83.0 12/25/2022   CHOLHDL 2 12/25/2022   Last hemoglobin A1c Lab Results  Component Value Date   HGBA1C 6.3 12/25/2022      The 10-year ASCVD risk score (Arnett DK, et al., 2019) is: 18.2%    Assessment & Plan:   #1 possible subjective hematochezia.  Not noted on Hemoccult today.  She does have very small split in the skin with anal fissure around 12 o'clock position and some recent straining and constipation.  No associated pain.  Recommend sitz bath's.  Increase fluid intake.  Increase dietary fiber.  Consider stool softeners as needed.  MiraLAX as needed.  Her colonoscopy is up-to-date and reportedly had 1 this past April in [place]  #2 hypertension.  Controlled today but some sporadic elevated readings recently.  She inquired whether she could increase her losartan to 25 mg twice daily for days this is up and we do not see any problem with elevating the short-term.  Also discussed measures to reduce stress with increased walking and exercise.  #3 history of hyperglycemia/prediabetes.  Last A1c 6.3%.  Recent A1c possibly exacerbated by  increased stress issues.  Recheck A1c in the next month or 2 before they had back to [name], MD

## 2023-03-12 ENCOUNTER — Other Ambulatory Visit: Payer: Self-pay | Admitting: Family Medicine

## 2023-03-12 MED ORDER — ATORVASTATIN CALCIUM 40 MG PO TABS: 40.0000 mg | ORAL_TABLET | Freq: Every day | ORAL | 3 refills | Status: DC

## 2023-03-12 NOTE — Telephone Encounter (Signed)
Prescription Request  03/12/2023  LOV: 03/10/2023  What is the name of the medication or equipment?  atorvastatin atorvastatin (LIPITOR) 40 MG tablet  Spouse informed MD is OOO on Thursdays.  Have you contacted your pharmacy to request a refill? No   Which pharmacy would you like this sent to?   [facility] [address] [place],  -  [address] [place] [address] Phone: [phone] Fax: [phone]  Patient notified that their request is being sent to the clinical staff for review and that they should receive a response within 2 business days.   Please advise at Mobile [phone] (mobile)

## 2023-04-29 DIAGNOSIS — Z1339 Encounter for screening examination for other mental health and behavioral disorders: Secondary | ICD-10-CM | POA: Diagnosis not present

## 2023-04-29 DIAGNOSIS — Z1331 Encounter for screening for depression: Secondary | ICD-10-CM | POA: Diagnosis not present

## 2023-04-29 DIAGNOSIS — M62838 Other muscle spasm: Secondary | ICD-10-CM | POA: Diagnosis not present

## 2023-04-29 DIAGNOSIS — I10 Essential (primary) hypertension: Secondary | ICD-10-CM | POA: Diagnosis not present

## 2023-06-17 DIAGNOSIS — L821 Other seborrheic keratosis: Secondary | ICD-10-CM | POA: Diagnosis not present

## 2023-06-17 DIAGNOSIS — L814 Other melanin hyperpigmentation: Secondary | ICD-10-CM | POA: Diagnosis not present

## 2023-06-17 DIAGNOSIS — D1721 Benign lipomatous neoplasm of skin and subcutaneous tissue of right arm: Secondary | ICD-10-CM | POA: Diagnosis not present

## 2023-06-17 DIAGNOSIS — L853 Xerosis cutis: Secondary | ICD-10-CM | POA: Diagnosis not present

## 2023-06-17 DIAGNOSIS — D225 Melanocytic nevi of trunk: Secondary | ICD-10-CM | POA: Diagnosis not present

## 2023-06-17 DIAGNOSIS — Z1283 Encounter for screening for malignant neoplasm of skin: Secondary | ICD-10-CM | POA: Diagnosis not present

## 2023-06-17 DIAGNOSIS — D485 Neoplasm of uncertain behavior of skin: Secondary | ICD-10-CM | POA: Diagnosis not present

## 2023-06-17 DIAGNOSIS — L578 Other skin changes due to chronic exposure to nonionizing radiation: Secondary | ICD-10-CM | POA: Diagnosis not present

## 2023-08-03 DIAGNOSIS — Z1231 Encounter for screening mammogram for malignant neoplasm of breast: Secondary | ICD-10-CM | POA: Diagnosis not present

## 2023-09-03 ENCOUNTER — Other Ambulatory Visit: Payer: Self-pay | Admitting: Family Medicine

## 2023-10-19 ENCOUNTER — Encounter: Payer: Self-pay | Admitting: Family Medicine

## 2023-10-19 ENCOUNTER — Ambulatory Visit: Admitting: Family Medicine

## 2023-10-19 VITALS — BP 150/76 | HR 64 | Temp 97.8°F | Wt 121.6 lb

## 2023-10-19 DIAGNOSIS — I10 Essential (primary) hypertension: Secondary | ICD-10-CM | POA: Diagnosis not present

## 2023-10-19 DIAGNOSIS — E785 Hyperlipidemia, unspecified: Secondary | ICD-10-CM

## 2023-10-19 NOTE — Patient Instructions (Signed)
 Get back on daily Losartan   Be in touch if BP consistently > 140 systolic after being on the Losartan  for 2 weeks.

## 2023-10-19 NOTE — Progress Notes (Signed)
 Established Patient Office Visit  Subjective   Patient ID: Claire [name], female    DOB: 1950-07-04  Age: 73 y.o. MRN: [id number]  No chief complaint on file.   HPI   Ms. Brave is seen for some recent blood pressure lability issues.  She takes losartan  25 mg daily and states that she had blood pressures as low as 108/60 after she been off losartan  for couple days several weeks ago.  She stayed off the losartan  for a full month and had fairly consistent readings of 110-120s systolic and 60s diastolic.  She has recently had some increased readings up around 140/80 which she attributed to some stress.  Blood pressure today is considerably higher 150/76 and repeat after rest 158/62.  Denies any recent headaches or chest pains.  She has hyperlipidemia treated with atorvastatin  40 mg daily.  Lipids were checked 9 months ago with total cholesterol 139 and LDL of 64.  She and her husband currently live in [place].  They have been here recently visiting their son who lives in the [place] area who has been battling rectal cancer.  She is very health-conscious and exercises regularly with walking and eats a lot of fruit and vegetables and very little processed food and watches her sodium closely.  No alcohol use.  Past Medical History:  Diagnosis Date   Arthritis    Cataracts, bilateral 2018   in November; dr. Milan   GERD (gastroesophageal reflux disease)    History of TIA (transient ischemic attack) 05/2019   Hyperlipidemia    Osteopenia    Positive TB test    Past Surgical History:  Procedure Laterality Date   BREAST BIOPSY Left    No noticable scar, Benign    BREAST EXCISIONAL BIOPSY Left 1996   CESAREAN SECTION  1982   ESOPHAGOGASTRODUODENOSCOPY ENDOSCOPY  01/01/2018   SHOULDER ARTHROSCOPY Left 2012   left   WRIST FRACTURE SURGERY Right 2004   Right    reports that she has never smoked. She has never used smokeless tobacco. She reports that she does not drink  alcohol and does not use drugs. family history includes Hyperlipidemia in her father; Lymphoma in her brother. Allergies  Allergen Reactions   Penicillins Anaphylaxis    Review of Systems  Constitutional:  Negative for malaise/fatigue.  Eyes:  Negative for blurred vision.  Respiratory:  Negative for shortness of breath.   Cardiovascular:  Negative for chest pain.  Neurological:  Negative for dizziness, weakness and headaches.      Objective:     BP (!) 150/76 (BP Location: Left Arm, Patient Position: Sitting, Cuff Size: Normal)   Pulse 64   Temp 97.8 F (36.6 C) (Oral)   Wt 121 lb 9.6 oz (55.2 kg)   SpO2 97%   BMI 23.75 kg/m  BP Readings from Last 3 Encounters:  10/19/23 (!) 150/76  03/10/23 128/64  12/24/22 120/60   Wt Readings from Last 3 Encounters:  10/19/23 121 lb 9.6 oz (55.2 kg)  03/10/23 126 lb 9.6 oz (57.4 kg)  12/24/22 121 lb (54.9 kg)      Physical Exam Vitals reviewed.  Constitutional:      Appearance: She is well-developed.  Eyes:     Pupils: Pupils are equal, round, and reactive to light.  Neck:     Thyroid : No thyromegaly.     Vascular: No JVD.  Cardiovascular:     Rate and Rhythm: Normal rate and regular rhythm.  Heart sounds:     No gallop.  Pulmonary:     Effort: Pulmonary effort is normal. No respiratory distress.     Breath sounds: Normal breath sounds. No wheezing or rales.  Musculoskeletal:     Cervical back: Neck supple.     Right lower leg: No edema.     Left lower leg: No edema.  Neurological:     Mental Status: She is alert.      No results found for any visits on 10/19/23.  Last CBC Lab Results  Component Value Date   WBC 4.7 12/25/2022   HGB 12.5 12/25/2022   HCT 38.2 12/25/2022   MCV 92.6 12/25/2022   MCH 30.0 08/31/2015   RDW 12.8 12/25/2022   PLT 265.0 12/25/2022   Last metabolic panel Lab Results  Component Value Date   GLUCOSE 107 (H) 12/25/2022   NA 142 12/25/2022   K 4.0 12/25/2022   CL 106  12/25/2022   CO2 29 12/25/2022   BUN 16 12/25/2022   CREATININE 0.85 12/25/2022   GFR 67.68 12/25/2022   CALCIUM  9.1 12/25/2022   PROT 6.7 12/25/2022   ALBUMIN 4.0 12/25/2022   BILITOT 0.7 12/25/2022   ALKPHOS 56 12/25/2022   AST 27 12/25/2022   ALT 33 12/25/2022   Last lipids Lab Results  Component Value Date   CHOL 139 12/25/2022   HDL 57.80 12/25/2022   LDLCALC 64 12/25/2022   TRIG 83.0 12/25/2022   CHOLHDL 2 12/25/2022   Last hemoglobin A1c Lab Results  Component Value Date   HGBA1C 6.3 12/25/2022      The 10-year ASCVD risk score (Arnett DK, et al., 2019) is: 24.1%    Assessment & Plan:   #1 hypertension.  Elevated readings today.  Somewhat labile readings at home.  She has had mostly good control but has recently had some readings up around 140s systolic.  Repeat today after rest left arm seated 158/62.  We have recommended that she get back on her losartan  25 mg daily.  Continue low-sodium diet.  Continue regular exercise.  Also suggest that she bring in her cuff to compare with ours or another manual cuff at some point in the near future to gauge accuracy.  Be in touch if blood pressures consistently over 140 systolic after being back on losartan  for several weeks  #2 hyperlipidemia.  On atorvastatin .  Lipids were checked 9 months ago.  Needs follow-up fasting lipids if possible in about 4 months  Wolm Scarlet, MD

## 2023-10-23 DIAGNOSIS — H40013 Open angle with borderline findings, low risk, bilateral: Secondary | ICD-10-CM | POA: Diagnosis not present

## 2023-10-23 DIAGNOSIS — H18413 Arcus senilis, bilateral: Secondary | ICD-10-CM | POA: Diagnosis not present

## 2023-10-23 DIAGNOSIS — Z961 Presence of intraocular lens: Secondary | ICD-10-CM | POA: Diagnosis not present

## 2023-10-23 DIAGNOSIS — H16223 Keratoconjunctivitis sicca, not specified as Sjogren's, bilateral: Secondary | ICD-10-CM | POA: Diagnosis not present

## 2023-10-26 DIAGNOSIS — K3189 Other diseases of stomach and duodenum: Secondary | ICD-10-CM | POA: Diagnosis not present

## 2023-10-26 DIAGNOSIS — Z09 Encounter for follow-up examination after completed treatment for conditions other than malignant neoplasm: Secondary | ICD-10-CM | POA: Diagnosis not present

## 2023-10-26 DIAGNOSIS — K5730 Diverticulosis of large intestine without perforation or abscess without bleeding: Secondary | ICD-10-CM | POA: Diagnosis not present

## 2023-10-26 DIAGNOSIS — D123 Benign neoplasm of transverse colon: Secondary | ICD-10-CM | POA: Diagnosis not present

## 2023-10-26 DIAGNOSIS — R1013 Epigastric pain: Secondary | ICD-10-CM | POA: Diagnosis not present

## 2023-10-26 DIAGNOSIS — Z860101 Personal history of adenomatous and serrated colon polyps: Secondary | ICD-10-CM | POA: Diagnosis not present

## 2023-10-26 DIAGNOSIS — K648 Other hemorrhoids: Secondary | ICD-10-CM | POA: Diagnosis not present

## 2023-10-26 DIAGNOSIS — D124 Benign neoplasm of descending colon: Secondary | ICD-10-CM | POA: Diagnosis not present

## 2023-10-26 LAB — HM COLONOSCOPY

## 2023-10-28 DIAGNOSIS — D123 Benign neoplasm of transverse colon: Secondary | ICD-10-CM | POA: Diagnosis not present

## 2023-10-28 DIAGNOSIS — K3189 Other diseases of stomach and duodenum: Secondary | ICD-10-CM | POA: Diagnosis not present

## 2023-10-28 DIAGNOSIS — D124 Benign neoplasm of descending colon: Secondary | ICD-10-CM | POA: Diagnosis not present

## 2024-02-23 ENCOUNTER — Other Ambulatory Visit: Payer: Self-pay | Admitting: Family Medicine

## 2024-02-27 ENCOUNTER — Other Ambulatory Visit: Payer: Self-pay | Admitting: Family Medicine

## 2024-03-02 DIAGNOSIS — F419 Anxiety disorder, unspecified: Secondary | ICD-10-CM | POA: Diagnosis not present

## 2024-03-02 DIAGNOSIS — E669 Obesity, unspecified: Secondary | ICD-10-CM | POA: Diagnosis not present

## 2024-03-02 DIAGNOSIS — I10 Essential (primary) hypertension: Secondary | ICD-10-CM | POA: Diagnosis not present

## 2024-03-02 DIAGNOSIS — R002 Palpitations: Secondary | ICD-10-CM | POA: Diagnosis not present

## 2024-03-02 DIAGNOSIS — R739 Hyperglycemia, unspecified: Secondary | ICD-10-CM | POA: Diagnosis not present

## 2024-03-08 DIAGNOSIS — I10 Essential (primary) hypertension: Secondary | ICD-10-CM | POA: Diagnosis not present

## 2024-03-20 ENCOUNTER — Other Ambulatory Visit: Payer: Self-pay | Admitting: Family Medicine

## 2024-04-25 ENCOUNTER — Other Ambulatory Visit: Payer: Self-pay | Admitting: Family Medicine
